# Patient Record
Sex: Female | Born: 1970 | Race: Black or African American | Hispanic: No | Smoking: Never smoker
Health system: Southern US, Community
[De-identification: ages and names within clinical notes are randomized; demographics above are authoritative.]

## PROBLEM LIST (undated history)

## (undated) ENCOUNTER — Emergency Department (HOSPITAL_COMMUNITY): Admission: EM | Payer: BC Managed Care – PPO | Source: Home / Self Care

## (undated) DIAGNOSIS — I10 Essential (primary) hypertension: Secondary | ICD-10-CM

## (undated) DIAGNOSIS — K219 Gastro-esophageal reflux disease without esophagitis: Secondary | ICD-10-CM

## (undated) DIAGNOSIS — I639 Cerebral infarction, unspecified: Secondary | ICD-10-CM

## (undated) DIAGNOSIS — D649 Anemia, unspecified: Secondary | ICD-10-CM

## (undated) DIAGNOSIS — R011 Cardiac murmur, unspecified: Secondary | ICD-10-CM

## (undated) DIAGNOSIS — E785 Hyperlipidemia, unspecified: Secondary | ICD-10-CM

## (undated) HISTORY — PX: ABDOMINAL HYSTERECTOMY: SHX81

## (undated) HISTORY — DX: Anemia, unspecified: D64.9

## (undated) HISTORY — DX: Hyperlipidemia, unspecified: E78.5

## (undated) HISTORY — DX: Cardiac murmur, unspecified: R01.1

## (undated) HISTORY — DX: Cerebral infarction, unspecified: I63.9

---

## 2007-02-07 ENCOUNTER — Other Ambulatory Visit: Admission: RE | Admit: 2007-02-07 | Discharge: 2007-02-07 | Payer: Self-pay | Admitting: Obstetrics and Gynecology

## 2007-02-12 ENCOUNTER — Ambulatory Visit (HOSPITAL_COMMUNITY): Admission: RE | Admit: 2007-02-12 | Discharge: 2007-02-12 | Payer: Self-pay | Admitting: Obstetrics & Gynecology

## 2008-01-10 ENCOUNTER — Encounter: Payer: Self-pay | Admitting: Obstetrics and Gynecology

## 2008-01-10 ENCOUNTER — Ambulatory Visit (HOSPITAL_COMMUNITY): Admission: RE | Admit: 2008-01-10 | Discharge: 2008-01-11 | Payer: Self-pay | Admitting: Obstetrics and Gynecology

## 2008-01-25 ENCOUNTER — Ambulatory Visit (HOSPITAL_COMMUNITY): Admission: RE | Admit: 2008-01-25 | Discharge: 2008-01-25 | Payer: Self-pay | Admitting: Obstetrics and Gynecology

## 2010-04-18 ENCOUNTER — Encounter: Payer: Self-pay | Admitting: Obstetrics & Gynecology

## 2010-08-10 NOTE — Op Note (Signed)
Robin Fuentes, Robin Fuentes NO.:  [id number]   MEDICAL RECORD NO.:  [id number]          PATIENT TYPE:  AMB   LOCATION:  DAY                           FACILITY:  [facility]   PHYSICIAN:  Tilda Burrow, M.D. DATE OF BIRTH:  1971-01-30   DATE OF PROCEDURE:  01/25/2008  DATE OF DISCHARGE:                               OPERATIVE REPORT   PREOPERATIVE DIAGNOSIS:  Vaginal cuff bleeding status post hysterectomy.   POSTOPERATIVE DIAGNOSIS:  Vaginal cuff bleeding status post  hysterectomy.   PROCEDURE:  Oversew of vaginal cuff.   SURGEON:  Tilda Burrow, MD.   ASSISTANTAne Payment..   ANESTHESIA:  General with LMA.   COMPLICATIONS:  None.   FINDINGS:  Separation of the vaginal cuff after apparent absorption of  sutures.   INDICATIONS:  A 40 year old female who was recovering nicely, 2-week  status post procedure, when she developed heavy bleeding and a clot seen  in the office where the bleeding was excessive for office closure.   DETAILS OF PROCEDURE:  The patient was taken to the operating room,  prepped and draped.  A time-out was conducted before the start of  procedure, patient, and providers confirmed.  A 3-inch weighted speculum  was placed in the vagina and in the lateral side rubber retractors were  used.  The Surgicel and 2 vaginal packs in the office were removed.  These were saturated with fairly fresh blood.  The cuff itself could  then be inspected and the uterosacral pedicles on either side were  visible and hemostatic, and showed evidence of healing by secondary  intention.  Between those vaginal cuff was fractionally opened and  fractionally disrupted.  There was no visible entry in the peritoneal  cavity.  The patient had easy closure of the cuff, beginning over to the  patient's left side and placing interrupted sutures of 0-Vicryl and  alternating with 2-0 Vicryl, sewing across the cuff with good tissue  edge approximation and good  hemostasis.  At the end of the procedure,  the tissues were reapproximated as previously  attached prior to the disruption.  It is unknown if the patient did  simply strain, lifted, or what event resulted in the disruption.  The  patient will be allowed to go home and follow up will be in 2 weeks in  our office or earlier p.r.n. problems.      Tilda Burrow, M.D.  Electronically Signed     JVF/MEDQ  D:  01/25/2008  T:  01/26/2008  Job:  [id number]

## 2010-08-10 NOTE — Op Note (Signed)
Robin Fuentes, Robin Fuentes NO.:  [id number]   MEDICAL RECORD NO.:  [id number]          PATIENT TYPE:  OBV   LOCATION:  A311                          FACILITY:  [facility]   PHYSICIAN:  Tilda Burrow, M.D. DATE OF BIRTH:  Aug 03, 1970   DATE OF PROCEDURE:  01/10/2008  DATE OF DISCHARGE:                               OPERATIVE REPORT   PREOPERATIVE DIAGNOSES:  1. Uterine fibroids.  2. Menorrhagia.  3. Right ovarian cyst.   POSTOPERATIVE DIAGNOSES:  1. Menorrhagia.  2. Uterine fibroids.  3. Right ovarian dermoid cyst.   PROCEDURE:  1. Laparoscopic right ovarian cystectomy.  2. Laparoscopically-assisted vaginal hysterectomy.   SURGEON:  Tilda Burrow, MD   ASSISTANT:  Marlinda Mike, RN   ANESTHESIA:  General.   COMPLICATIONS:  None.   ESTIMATED BLOOD LOSS:  150 mL.   FINDINGS:  Enlarged right ovary, normal-appearing left ovary, aspiration  of cyst structure on right ovary confirming dermoid cyst, which was then  removed, mobile uterus with fibroid deformity in the area of the right  ligament insertion area.   DETAILS OF PROCEDURE:  The patient was taken to the operating room,  prepped and draped for combined abdominal and vaginal procedure.  The  cervix was grasped with single-tooth tenaculum.  Uterine manipulator  inserted, then Foley catheter inserted.  Time-out had been connected and  procedures confirmed by all parties.  The infraumbilical vertical 1-cm  skin incision was performed, introducing a Veress needle, achieving  pneumoperitoneum easily with water droplet test used to confirm  intraperitoneal location.  The needle was oriented towards the pelvis.  Pneumoperitoneum was easily achieved and 10-mm laparoscopic trocar  introduced.  Abdomen was visualized and no evidence of trauma suspected.  Suprapubic, right lower quadrant, and left lower quadrant 5-mm trocars  were inserted.  Attention was then directed to the pelvis.  The right  ovary was easily  visualized as being abnormal with the appearance  suggestive of a dermoid.  There was no stigma ovulation on the right  side.  Left ovary appeared normal with evidence of recent ovulation.  The uterus was easily identified as being mobile along with the enlarged  fibroids.  Attention was directed to ovary and after a needle aspiration  revealed that there was in deed likely sebaceous material inside the  ovary.  The cyst was decompressed and then the overlying normal ovarian  tissue opened through a 3-cm opening and the cyst grasped with  laparoscopic-grasping devices and gradually peeled out of the ovary.  The patient then had inspection of the pedicles at some point, cautery  is necessary inside the ovary, this was achieved with adequate  hemostasis.  Attention was then directed to the uterus.  Hysterectomy  followed by identifying the right round ligament, grasping it, elevating  it, and then cross-clamping the utero-ovarian ligament and fallopian  tube, transecting those with harmonic scalpel.  The harmonic scalpel  malfunctioned.  They were no longer defectively cut, so we converted to  a Gyrus bipolar cautery transection device, which was then used to march  down through the right round ligament and  the broad ligament down to the  level of the uterine vessels.  The uterine vessels were not transected  laparoscopically.  On the left side, the similar procedure was completed  with the Gyrus cautery cutting 5-mm device.  At this point, the bladder  flap was partially developed anteriorly using Gyrus transection and  mobilization of the bladder flap.  We then discontinued the abdominal  procedure, removed all laparoscopic instruments, left the sleeves in  place, and decompressed the abdomen.  Attention was then moved to the  vaginal area, where the cervix could be grasped with single-tooth  tenaculum, retracted toward the vaginal introitus, and the cervix  circumscribed with Bovie  cautery.  Marcaine with epinephrine was  injected beneath the skin of the area to be cut.  Posterior colpotomy  incision was then performed and then the uterosacral ligaments on either  side clamped, cut, sutured, and ligated using Zeppelin clamps, Mayo  scissors, and 0-chromic suture ligature.  The lower and upper cardinal  ligaments were then serially clamped, cut, and suture ligated.  The  bladder flap was gradually pushed upward as the cardinal ligaments were  taken down.  We were very careful as we were extremely close to the  cervix and take small bites until this dissection proceeded.  Attention  was then made to identifying the remaining tissue between the  laparoscopic portions of dissection and the vaginal portions.  The  uterus required morcellation and the cervix and lower uterine segment  were taken out in a wedge and then the remaining portion of the uterus  could be rotated sufficiently with remaining pedicle on either side to  be clamped, transected, and ligated with 0-chromic.  This was the  specimen consisting of the uterine fundal, fundal portion was then  extracted.  Estimated uterine weight 175 g.   The hemostasis was quite good except for a small area on the posterior  colpotomy skin edges.  A suture of 2-0 Prolene was placed through the  uterosacral ligament on either side and loosely reapproximated to give  vaginal apex support.  This was tied and buried above the cuff.  Peritoneum was then closed in the midline pulling the bladder flap  peritoneum to the posterior colpotomy peritoneum.  The rest of the cuff  was then closed with running 2-0 chromic with good tissue edge  approximation.   We then went up to the pelvis again, reinflated, inspected the pelvis,  and found the vaginal cuff to have excellent hemostasis.  There was some  oozing from the bed of the ovarian cystectomy on the right and this was  resolved with point cautery performed laparoscopically.  The  patient  tolerated the procedure well, had irrigation of the abdomen, deflation  of the abdomen once again.  Closure of the umbilical site with 0-Vicryl  of the fascia and 3-0 Vicryl subcuticular closure of all 4 puncture  sites.  Vaginal packing was then placed.  Urine output was 250 mL  intraop.  The patient went to the recovery room in excellent condition.  Sponge and needle counts were correct.      Tilda Burrow, M.D.  Electronically Signed    JVF/MEDQ  D:  01/10/2008  T:  01/11/2008  Job:  [id number]   cc:   [facility]

## 2010-12-08 ENCOUNTER — Other Ambulatory Visit: Payer: Self-pay | Admitting: Obstetrics and Gynecology

## 2010-12-08 DIAGNOSIS — Z1231 Encounter for screening mammogram for malignant neoplasm of breast: Secondary | ICD-10-CM

## 2010-12-14 ENCOUNTER — Ambulatory Visit (HOSPITAL_COMMUNITY): Payer: Self-pay

## 2010-12-14 ENCOUNTER — Ambulatory Visit (HOSPITAL_COMMUNITY)
Admission: RE | Admit: 2010-12-14 | Discharge: 2010-12-14 | Disposition: A | Discharge status: Home / Self Care | Payer: Self-pay | Source: Ambulatory Visit | Attending: Obstetrics and Gynecology | Admitting: Obstetrics and Gynecology

## 2010-12-14 DIAGNOSIS — Z1231 Encounter for screening mammogram for malignant neoplasm of breast: Secondary | ICD-10-CM

## 2010-12-28 LAB — BASIC METABOLIC PANEL
GFR calc Af Amer: >60
Sodium: 136

## 2010-12-28 LAB — COMPREHENSIVE METABOLIC PANEL
ALT: 14
AST: 17
Albumin: 3.8
Alkaline Phosphatase: 72
Chloride: 105
Creatinine, Ser: 0.86
GFR calc non Af Amer: >60
Glucose, Bld: 72

## 2010-12-28 LAB — TYPE AND SCREEN
ABO/RH(D): B POS
Antibody Screen: NEGATIVE

## 2010-12-28 LAB — DIFFERENTIAL
Basophils Relative: 0
Eosinophils Absolute: 0
Eosinophils Relative: 0
Eosinophils Relative: 2
Lymphocytes Relative: 13
Lymphocytes Relative: 31
Lymphs Abs: 1.8
Monocytes Absolute: 1.1 — ABNORMAL HIGH
Monocytes Relative: 9
Neutro Abs: 3.8
Neutrophils Relative %: 78 — ABNORMAL HIGH

## 2010-12-28 LAB — CBC
HCT: 31.8 — ABNORMAL LOW
HCT: 37.9
HCT: 41.8
MCV: 74.3 — ABNORMAL LOW
Platelets: 274
RBC: 5.08
RDW: 12.8
RDW: 13.8
WBC: 5.8
WBC: 6.2

## 2011-10-12 ENCOUNTER — Encounter (HOSPITAL_COMMUNITY): Payer: Self-pay | Admitting: *Deleted

## 2011-10-12 ENCOUNTER — Emergency Department (HOSPITAL_COMMUNITY)
Admission: EM | Admit: 2011-10-12 | Discharge: 2011-10-12 | Disposition: A | Discharge status: Home / Self Care | Payer: Self-pay | Attending: Emergency Medicine | Admitting: Emergency Medicine

## 2011-10-12 ENCOUNTER — Emergency Department (HOSPITAL_COMMUNITY): Payer: Self-pay

## 2011-10-12 DIAGNOSIS — H6120 Impacted cerumen, unspecified ear: Secondary | ICD-10-CM | POA: Insufficient documentation

## 2011-10-12 DIAGNOSIS — K0889 Other specified disorders of teeth and supporting structures: Secondary | ICD-10-CM

## 2011-10-12 DIAGNOSIS — K089 Disorder of teeth and supporting structures, unspecified: Secondary | ICD-10-CM | POA: Insufficient documentation

## 2011-10-12 DIAGNOSIS — I10 Essential (primary) hypertension: Secondary | ICD-10-CM | POA: Insufficient documentation

## 2011-10-12 HISTORY — DX: Essential (primary) hypertension: I10

## 2011-10-12 MED ORDER — PENICILLIN V POTASSIUM 500 MG PO TABS: 500.0000 mg | ORAL_TABLET | Freq: Four times a day (QID) | ORAL | Status: AC

## 2011-10-12 MED ORDER — HYDROCODONE-ACETAMINOPHEN 5-325 MG PO TABS
2.0000 | ORAL_TABLET | Freq: Once | ORAL | Status: AC
  Administered 2011-10-12: 2 via ORAL
  Filled 2011-10-12: qty 2

## 2011-10-12 MED ORDER — HYDROCODONE-ACETAMINOPHEN 5-325 MG PO TABS: 2.0000 | ORAL_TABLET | ORAL | Status: AC | PRN

## 2011-10-12 NOTE — ED Provider Notes (Signed)
History     CSN: [id number]  Arrival date & time 10/12/11  Robin Fuentes   First MD Initiated Contact with Patient 10/12/11 2043      Chief Complaint  Patient presents with  . Otalgia  . Numbness    (Consider location/radiation/quality/duration/timing/severity/associated sxs/prior treatment) HPI Comments: Patient presents with right-sided facial pain, right ear pain, dental pain with numbness and tingling for the past 10 days. The pain is intermittent but getting more severe. No fever, vomiting, chills, difficulty breathing or swallowing. No chest pain or shortness of breath. No change in vision, no change in hearing. No focal weakness. She's been taking sinus medicine without relief. Denies any cough, rhinorrhea or congestion.  The history is provided by the patient.    Past Medical History  Diagnosis Date  . Hypertension     Past Surgical History  Procedure Date  . Abdominal hysterectomy     History reviewed. No pertinent family history.  History  Substance Use Topics  . Smoking status: Never Smoker   . Smokeless tobacco: Not on file  . Alcohol Use: No    OB History    Grav Para Term Preterm Abortions TAB SAB Ect Mult Living                  Review of Systems  Constitutional: Negative for fever, activity change and appetite change.  HENT: Positive for ear pain and dental problem. Negative for congestion, sore throat, rhinorrhea, trouble swallowing, neck pain, neck stiffness and voice change.   Respiratory: Negative for cough, chest tightness and shortness of breath.   Cardiovascular: Negative for chest pain.  Gastrointestinal: Negative for nausea, vomiting and abdominal pain.  Genitourinary: Negative for dysuria.  Neurological: Positive for numbness and headaches. Negative for dizziness, facial asymmetry, speech difficulty and weakness.    Allergies  Sulfa antibiotics  Home Medications   Current Outpatient Rx  Name Route Sig Dispense Refill  . ALBUTEROL SULFATE  HFA 108 (90 BASE) MCG/ACT IN AERS Inhalation Inhale 2 puffs into the lungs every 6 (six) hours as needed.    Marland Kitchen LISINOPRIL-HYDROCHLOROTHIAZIDE 20-12.5 MG PO TABS Oral Take 0.5 tablets by mouth every morning.    Marland Kitchen NAPROXEN SODIUM 220 MG PO CAPS Oral Take 220 mg by mouth as needed.    Marland Kitchen PSEUDOEPHEDRINE HCL 60 MG PO TABS Oral Take 60 mg by mouth every 4 (four) hours as needed.      BP 140/89  Pulse 93  Temp 98.3 F (36.8 C) (Oral)  Resp 20  Ht 5\' 4"  (1.626 m)  Wt 130 lb (58.968 kg)  BMI 22.31 kg/m2  SpO2 99%  Physical Exam  Constitutional: She is oriented to person, place, and time. She appears well-developed and well-nourished. No distress.  HENT:  Head: Normocephalic and atraumatic.  Left Ear: External ear normal.  Mouth/Throat: Oropharynx is clear and moist. No oropharyngeal exudate.       Cerumen impaction right Poor dentition throughout. Right upper molar tender to palpation without abscess. No tongue elevation, no trismus  Eyes: Conjunctivae and EOM are normal. Pupils are equal, round, and reactive to light.  Neck: Normal range of motion. Neck supple.       No menigismus  Cardiovascular: Normal rate, regular rhythm and normal heart sounds.   No murmur heard. Pulmonary/Chest: Effort normal and breath sounds normal. No respiratory distress.  Abdominal: Soft. There is no tenderness. There is no rebound and no guarding.  Musculoskeletal: Normal range of motion. She exhibits no edema and no  tenderness.  Neurological: She is alert and oriented to person, place, and time. No cranial nerve deficit.       No facial asymmetry, 5 out of strength throughout, no nystagmus, no ataxia finger to nose, visual fields full to confrontation  Skin: Skin is warm.    ED Course  Procedures (including critical care time)  Labs Reviewed - No data to display Ct Head Wo Contrast  10/12/2011  *RADIOLOGY REPORT*  Clinical Data: Right ear pain and right numbness and tingling for one and one half weeks.   CT HEAD WITHOUT CONTRAST  Technique:  Contiguous axial images were obtained from the base of the skull through the vertex without contrast.  Comparison: None.  Findings: The ventricles and sulci are symmetrical without significant effacement, displacement, or dilatation. No mass effect or midline shift. No abnormal extra-axial fluid collections. The grey-white matter junction is distinct. Basal cisterns are not effaced. No acute intracranial hemorrhage. No depressed skull fractures.  Visualized portions of the mastoid air cells and paranasal sinuses are not opacified.  The external auditory canal on the right is poorly defined and patency is not confirmed. Likely there is some opacification due to cerumen.  Correlate with otoscopic examination.  IMPRESSION: No acute intracranial abnormalities.  Original Report Authenticated By: Marlon Pel, M.D.     No diagnosis found.    MDM  Right ear pain, right facial pain, right dental pain. No fever, vomiting, chest pain or shortness of breath.  No facial asymmetry, no focal weakness, no ataxia.  No deficit to suggest CVA.  Will irrigate ear.  treat dental pain, follow up with dentist and PCP.       Glynn Octave, MD 10/12/11 2150

## 2011-10-12 NOTE — ED Notes (Signed)
Right ear pain and right numbness/ tingling ongoing off and on for 1 1/2 week

## 2012-02-29 ENCOUNTER — Other Ambulatory Visit (HOSPITAL_COMMUNITY): Payer: Self-pay | Admitting: Internal Medicine

## 2013-01-21 ENCOUNTER — Observation Stay (HOSPITAL_COMMUNITY): Payer: BC Managed Care – PPO

## 2013-01-21 ENCOUNTER — Inpatient Hospital Stay (HOSPITAL_COMMUNITY): Payer: BC Managed Care – PPO

## 2013-01-21 ENCOUNTER — Emergency Department (HOSPITAL_COMMUNITY): Payer: BC Managed Care – PPO

## 2013-01-21 ENCOUNTER — Inpatient Hospital Stay (HOSPITAL_COMMUNITY)
Admission: EM | Admit: 2013-01-21 | Discharge: 2013-01-24 | DRG: 093 | Disposition: A | Discharge status: Home / Self Care | Payer: BC Managed Care – PPO | Attending: Internal Medicine | Admitting: Internal Medicine

## 2013-01-21 ENCOUNTER — Encounter (HOSPITAL_COMMUNITY): Payer: Self-pay | Admitting: Emergency Medicine

## 2013-01-21 DIAGNOSIS — I639 Cerebral infarction, unspecified: Secondary | ICD-10-CM

## 2013-01-21 DIAGNOSIS — I10 Essential (primary) hypertension: Secondary | ICD-10-CM | POA: Diagnosis present

## 2013-01-21 DIAGNOSIS — I6350 Cerebral infarction due to unspecified occlusion or stenosis of unspecified cerebral artery: Secondary | ICD-10-CM

## 2013-01-21 DIAGNOSIS — K219 Gastro-esophageal reflux disease without esophagitis: Secondary | ICD-10-CM | POA: Diagnosis present

## 2013-01-21 DIAGNOSIS — J449 Chronic obstructive pulmonary disease, unspecified: Secondary | ICD-10-CM | POA: Diagnosis present

## 2013-01-21 DIAGNOSIS — R200 Anesthesia of skin: Secondary | ICD-10-CM | POA: Diagnosis present

## 2013-01-21 DIAGNOSIS — R209 Unspecified disturbances of skin sensation: Principal | ICD-10-CM | POA: Diagnosis present

## 2013-01-21 DIAGNOSIS — J4489 Other specified chronic obstructive pulmonary disease: Secondary | ICD-10-CM | POA: Diagnosis present

## 2013-01-21 HISTORY — DX: Gastro-esophageal reflux disease without esophagitis: K21.9

## 2013-01-21 LAB — RAPID URINE DRUG SCREEN, HOSP PERFORMED
Amphetamines: NOT DETECTED
Barbiturates: NOT DETECTED
Cocaine: NOT DETECTED
Tetrahydrocannabinol: NOT DETECTED

## 2013-01-21 LAB — LIPID PANEL
LDL Cholesterol: 95 mg/dL (ref 0–99)
Triglycerides: 60 mg/dL (ref <150)
VLDL: 12 mg/dL (ref 0–40)

## 2013-01-21 LAB — COMPREHENSIVE METABOLIC PANEL
ALT: 24 U/L (ref 0–35)
AST: 24 U/L (ref 0–37)
Calcium: 9.3 mg/dL (ref 8.4–10.5)
Creatinine, Ser: 0.87 mg/dL (ref 0.50–1.10)
GFR calc Af Amer: >90 mL/min (ref >90)
GFR calc non Af Amer: 80 mL/min — ABNORMAL LOW (ref >90)
Glucose, Bld: 100 mg/dL — ABNORMAL HIGH (ref 70–99)
Potassium: 3.6 mEq/L (ref 3.5–5.1)
Sodium: 134 mEq/L — ABNORMAL LOW (ref 135–145)
Total Bilirubin: 0.3 mg/dL (ref 0.3–1.2)
Total Protein: 8.1 g/dL (ref 6.0–8.3)

## 2013-01-21 LAB — HEMOGLOBIN A1C: Hgb A1c MFr Bld: 5.7 % — ABNORMAL HIGH (ref <5.7)

## 2013-01-21 LAB — DIFFERENTIAL
Eosinophils Absolute: 0.2 10*3/uL (ref 0.0–0.7)
Eosinophils Relative: 2 % (ref 0–5)
Lymphs Abs: 2.9 10*3/uL (ref 0.7–4.0)
Monocytes Relative: 7 % (ref 3–12)

## 2013-01-21 LAB — GLUCOSE, CAPILLARY: Glucose-Capillary: 92 mg/dL (ref 70–99)

## 2013-01-21 LAB — URINALYSIS, ROUTINE W REFLEX MICROSCOPIC
Bilirubin Urine: NEGATIVE
Nitrite: NEGATIVE
Protein, ur: NEGATIVE mg/dL
Specific Gravity, Urine: 1.015 (ref 1.005–1.030)
Urobilinogen, UA: 0.2 mg/dL (ref 0.0–1.0)

## 2013-01-21 LAB — HOMOCYSTEINE: Homocysteine: 10 umol/L (ref 4.0–15.4)

## 2013-01-21 LAB — CBC
HCT: 44.2 % (ref 36.0–46.0)
Hemoglobin: 14.5 g/dL (ref 12.0–15.0)
MCH: 24.5 pg — ABNORMAL LOW (ref 26.0–34.0)
MCV: 74.7 fL — ABNORMAL LOW (ref 78.0–100.0)
RBC: 5.92 MIL/uL — ABNORMAL HIGH (ref 3.87–5.11)

## 2013-01-21 LAB — RPR: RPR Ser Ql: NONREACTIVE

## 2013-01-21 LAB — TROPONIN I: Troponin I: <0.3 ng/mL (ref <0.30)

## 2013-01-21 LAB — MRSA PCR SCREENING: MRSA by PCR: NEGATIVE

## 2013-01-21 LAB — APTT: aPTT: 30 seconds (ref 24–37)

## 2013-01-21 MED ORDER — ENOXAPARIN SODIUM 40 MG/0.4ML ~~LOC~~ SOLN
40.0000 mg | SUBCUTANEOUS | Status: DC
  Administered 2013-01-21 – 2013-01-23 (×3): 40 mg via SUBCUTANEOUS
  Filled 2013-01-21 (×4): qty 0.4

## 2013-01-21 MED ORDER — ASPIRIN 325 MG PO TABS
325.0000 mg | ORAL_TABLET | Freq: Every day | ORAL | Status: DC
  Administered 2013-01-21 – 2013-01-24 (×4): 325 mg via ORAL
  Filled 2013-01-21 (×4): qty 1

## 2013-01-21 MED ORDER — ASPIRIN 300 MG RE SUPP
300.0000 mg | Freq: Every day | RECTAL | Status: DC
  Filled 2013-01-21 (×4): qty 1

## 2013-01-21 MED ORDER — ATENOLOL 25 MG PO TABS
50.0000 mg | ORAL_TABLET | Freq: Every day | ORAL | Status: DC
  Administered 2013-01-21 – 2013-01-24 (×4): 50 mg via ORAL
  Filled 2013-01-21 (×4): qty 2

## 2013-01-21 MED ORDER — SENNOSIDES-DOCUSATE SODIUM 8.6-50 MG PO TABS: 1.0000 | ORAL_TABLET | Freq: Every evening | ORAL | Status: DC | PRN

## 2013-01-21 MED ORDER — ALBUTEROL SULFATE HFA 108 (90 BASE) MCG/ACT IN AERS: 2.0000 | INHALATION_SPRAY | Freq: Four times a day (QID) | RESPIRATORY_TRACT | Status: DC | PRN

## 2013-01-21 NOTE — Evaluation (Signed)
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  She reported tingling in the right face and extremeties...this sx has resolved today per her report.  Clinical Impression   Pt was seen for evaluation and found to be at functional baseline.  She has no PT needs.    PT Assessment  Patent does not need any further PT services    Follow Up Recommendations  No PT follow up    Does the patient have the potential to tolerate intense rehabilitation      Barriers to Discharge        Equipment Recommendations  None recommended by PT    Recommendations for Other Services     Frequency      Precautions / Restrictions Precautions Precautions: None Restrictions Weight Bearing Restrictions: No   Pertinent Vitals/Pain       Mobility  Bed Mobility Bed Mobility: Supine to Sit;Sit to Supine Rolling Left: 7: Independent Supine to Sit: 7: Independent Sit to Supine: 7: Independent Transfers Sit to Stand: 7: Independent Stand to Sit: 7: Independent Details for Transfer Assistance: Patient did use bedrailing for hand hold during sit<>stand.   Ambulation/Gait Ambulation/Gait Assistance: 7: Independent Ambulation Distance (Feet): 175 Feet Assistive device: None Gait Pattern: Within Functional Limits Gait velocity: WNL General Gait Details: both hips are in external rotation during gait, but this is her norm Stairs: No Wheelchair Mobility Wheelchair Mobility: No    Exercises     PT Diagnosis:    PT Problem List:   PT Treatment Interventions:       PT Goals(Current goals can be found in the care plan section) Acute Rehab PT Goals Patient Stated Goal: to get better and go home  PT Goal Formulation: No goals set, d/c  therapy  Visit Information  Last PT Received On: 01/21/13 History of Present Illness: 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  She reported tingling in the right face and extremeties...this sx has resolved today per her report.       Prior Functioning  Home Living Family/patient expects to be discharged to:: Private residence Living Arrangements: Spouse/significant other Type of Home: Apartment Home Access: Stairs to enter [employer] of Steps: 25 Entrance Stairs-Rails: Can reach both Home Layout: One level Home Equipment: None Additional Comments: .  Prior Function Level of Independence: Independent Comments: +driving; +full time work Musician: No difficulties Dominant Hand: Right    Cognition  Cognition Arousal/Alertness: Awake/alert Behavior During Therapy: WFL for tasks assessed/performed Overall Cognitive Status: Within Functional Limits for tasks assessed    Extremity/Trunk Assessment Upper Extremity Assessment Upper Extremity Assessment: Overall WFL for tasks assessed;RUE deficits/detail RUE Deficits / Details: Patient reports RUE weakness with tasks compared to prior to admission.  RUE=LUE for this assessment.  Lower Extremity Assessment Lower Extremity Assessment: Overall WFL for tasks assessed Cervical / Trunk Assessment Cervical / Trunk Assessment: Normal   Balance Balance Balance Assessed: Yes Dynamic Standing Balance Dynamic Standing - Balance Support: No upper extremity supported Dynamic Standing - Level of Assistance: 6: Modified independent (Device/Increase time) High Level Balance High Level Balance Activites: Backward walking;Direction changes;Turns;Sudden stops;Head turns;Side stepping High Level Balance Comments: no LOB with above  End of Session PT - End of  Session Equipment Utilized During Treatment: Gait belt Activity Tolerance: Patient tolerated treatment well Patient left: in bed  GP      Myrlene Broker L 01/21/2013, 3:18 PM

## 2013-01-21 NOTE — Progress Notes (Signed)
Subjective: Patient was admitted last night due to rt side numbness. Her CT Scan was negative. Patient is feeling slightly better but still has the numbness.  Objective: Vital signs in last 24 hours: Temp:  [98.2 F (36.8 C)] 98.2 F (36.8 C) (10/27 0355) Pulse Rate:  [58-79] 58 (10/27 0730) Resp:  [13-22] 18 (10/27 0730) BP: (132-158)/(83-100) 132/89 mmHg (10/27 0730) SpO2:  [97 %-100 %] 100 % (10/27 0730) FiO2 (%):  [21 %] 21 % (10/27 0132) Weight:  [59.87 kg (131 lb 15.8 oz)-59.875 kg (132 lb)] 59.87 kg (131 lb 15.8 oz) (10/27 0346) Weight change:     Intake/Output from previous day:    PHYSICAL EXAM General appearance: alert and no distress Resp: clear to auscultation bilaterally Cardio: S1, S2 normal GI: soft, non-tender; bowel sounds normal; no masses,  no organomegaly Extremities: extremities normal, atraumatic, no cyanosis or edema  Lab Results:    @labtest @ [facility] No results found for this basename: PHART, PCO2, PO2ART, TCO2, HCO3,  in the last 72 hours CULTURES No results found for this or any previous visit (from the past 240 hour(s)). Studies/Results: Ct Head Wo Contrast  01/21/2013   CLINICAL DATA:  Hypertension, right-sided weakness.  EXAM: CT HEAD WITHOUT CONTRAST  TECHNIQUE: Contiguous axial images were obtained from the base of the skull through the vertex without intravenous contrast.  COMPARISON:  CT of head October 12, 2011  FINDINGS: The ventricles and sulci are normal. No intraparenchymal hemorrhage, mass effect nor midline shift. No acute large vascular territory infarcts.  No abnormal extra-axial fluid collections. Basal cisterns are patent.  No skull fracture. Visualized paranasal sinuses and mastoid air-cells are well-aerated. The included ocular globes and orbital contents are non-suspicious.  IMPRESSION: No acute intracranial process ; normal noncontrast CT of the head. If clinical concern for acute ischemia, MRI of the brain with diffusion-weighted  sequences would be more sensitive.   Electronically Signed   By: Awilda Metro   On: 01/21/2013 01:56    Medications: I have reviewed the patient's current medications.  Assesment: Active Problems:   Right facial numbness   HTN (hypertension)    Plan: Will continue telemetry Will follow MRI, Echo and carotid doppler Will do neurology consult.    LOS: 0 days   Robin Fuentes 01/21/2013, 8:11 AM

## 2013-01-21 NOTE — Progress Notes (Signed)
UR Chart Review Completed  

## 2013-01-21 NOTE — Consult Note (Signed)
[facility] Jamal Haskin A. Gerilyn Pilgrim, MD     [email or web address]          Robin Fuentes is an 42 y.o. female.   ASSESSMENT/PLAN:  1. Acute numbness involving the right facial and right upper extremity and to some degree the right leg. The differential diagnosis includes acute subcortical infarct. Risk factor is hypertension. The patient is relatively young however and if she turns her to have a stroke additional workup for stroke at a youngp may be needed. However, given her age multiple sclerosis/demyelinating to processes are also possibilities. Brain MRI and MRA will be obtained. Lipid panel, RPR, thyroid function tests and a homocystine levels will also be obtained. She think has a carotid and echo also pending. She will be maintained on aspirin therapy for now.  The patient is a 42 year old black female who has a history of hypertension presented with the acute onset of numbness and dysesthesias involving the right facial region and right upper extremity. There is also some symptoms involving the right leg. She does not report having no other weakness although she reports that the right leg gave out a couple of times yesterday. She woke up with the symptoms. She reports that they have not gotten worse. She does not report dysarthria, visual obscuration, blurred vision, diplopia or headaches. There is no chest pain, shortness of breath, GI GU symptoms. The review systems is otherwise negative.  GENERAL: This is a pleasant average weight female in no acute distress.  HEENT: Unremarkable.  ABDOMEN: soft  EXTREMITIES: No edema   BACK: Normal.  SKIN: Normal by inspection.    MENTAL STATUS: Alert and oriented. Speech, language and cognition are generally intact. Judgment and insight normal.   CRANIAL NERVES: Pupils are equal, round and reactive to light and accommodation; extra ocular movements are full, there is no significant nystagmus; visual fields are full; upper and lower  facial muscles are normal in strength and symmetric, there is no flattening of the nasolabial folds; tongue is midline; uvula is midline; shoulder elevation is normal.  MOTOR: Normal tone, bulk and strength; no pronator drift.  COORDINATION: Left finger to nose is normal, right finger to nose is normal, No rest tremor; no intention tremor; no postural tremor; no bradykinesia.  REFLEXES: Deep tendon reflexes are symmetrical and normal. Plantar responses are flexor bilaterally.   SENSATION: Mild reduction to light touch and temperature right facial region. The right side is actually normal however.      Past Medical History  Diagnosis Date  . Hypertension   . Acid reflux     Past Surgical History  Procedure Laterality Date  . Abdominal hysterectomy      History reviewed. No pertinent family history.  Social History:  reports that she has never smoked. She does not have any smokeless tobacco history on file. She reports that she does not drink alcohol or use illicit drugs.  Allergies:  Allergies  Allergen Reactions  . Aspirin   . Sulfa Antibiotics Itching and Rash    Medications:  Prior to Admission medications   Medication Sig Start Date End Date Taking? Authorizing Provider  atenolol (TENORMIN) 50 MG tablet Take 50 mg by mouth daily.   Yes Historical Provider, MD  pseudoephedrine (SUDAFED) 60 MG tablet Take 60 mg by mouth every 4 (four) hours as needed.   Yes Historical Provider, MD  albuterol (PROAIR HFA) 108 (90 BASE) MCG/ACT inhaler Inhale 2 puffs into the lungs every 6 (six) hours as needed.  Historical Provider, MD  Naproxen Sodium (ALEVE) 220 MG CAPS Take 220 mg by mouth as needed.    Historical Provider, MD     Scheduled Meds: . aspirin  300 mg Rectal Daily   Or  . aspirin  325 mg Oral Daily  . atenolol  50 mg Oral Daily  . enoxaparin (LOVENOX) injection  40 mg Subcutaneous Q24H   Continuous Infusions:  PRN Meds:.albuterol, senna-docusate   Blood  pressure 132/89, pulse 58, temperature 98.2 F (36.8 C), temperature source Oral, resp. rate 18, height 5\' 4"  (1.626 m), weight 59.87 kg (131 lb 15.8 oz), SpO2 100.00%.   Results for orders placed during the hospital encounter of 01/21/13 (from the past 48 hour(s))  GLUCOSE, CAPILLARY     Status: None   Collection Time    01/21/13  1:51 AM      Result Value Range   Glucose-Capillary 92  70 - 99 mg/dL   Comment 1 Documented in Chart     Comment 2 Notify RN    ETHANOL     Status: None   Collection Time    01/21/13  2:00 AM      Result Value Range   Alcohol, Ethyl (B) <11  0 - 11 mg/dL   Comment:            LOWEST DETECTABLE LIMIT FOR     SERUM ALCOHOL IS 11 mg/dL     FOR MEDICAL PURPOSES ONLY  PROTIME-INR     Status: None   Collection Time    01/21/13  2:00 AM      Result Value Range   Prothrombin Time 13.7  11.6 - 15.2 seconds   INR 1.07  0.00 - 1.49  APTT     Status: None   Collection Time    01/21/13  2:00 AM      Result Value Range   aPTT 30  24 - 37 seconds  CBC     Status: Abnormal   Collection Time    01/21/13  2:00 AM      Result Value Range   WBC 7.6  4.0 - 10.5 K/uL   RBC 5.92 (*) 3.87 - 5.11 MIL/uL   Hemoglobin 14.5  12.0 - 15.0 g/dL   HCT 84.6  96.2 - 95.2 %   MCV 74.7 (*) 78.0 - 100.0 fL   MCH 24.5 (*) 26.0 - 34.0 pg   MCHC 32.8  30.0 - 36.0 g/dL   RDW 84.1  32.4 - 40.1 %   Platelets 168  150 - 400 K/uL  DIFFERENTIAL     Status: None   Collection Time    01/21/13  2:00 AM      Result Value Range   Neutrophils Relative % 52  43 - 77 %   Neutro Abs 4.0  1.7 - 7.7 K/uL   Lymphocytes Relative 38  12 - 46 %   Lymphs Abs 2.9  0.7 - 4.0 K/uL   Monocytes Relative 7  3 - 12 %   Monocytes Absolute 0.6  0.1 - 1.0 K/uL   Eosinophils Relative 2  0 - 5 %   Eosinophils Absolute 0.2  0.0 - 0.7 K/uL   Basophils Relative 0  0 - 1 %   Basophils Absolute 0.0  0.0 - 0.1 K/uL  TROPONIN I     Status: None   Collection Time    01/21/13  2:00 AM      Result Value Range    Troponin I <  0.30  <0.30 ng/mL   Comment:            Due to the release kinetics of cTnI,     a negative result within the first hours     of the onset of symptoms does not rule out     myocardial infarction with certainty.     If myocardial infarction is still suspected,     repeat the test at appropriate intervals.  COMPREHENSIVE METABOLIC PANEL     Status: Abnormal   Collection Time    01/21/13  2:00 AM      Result Value Range   Sodium 134 (*) 135 - 145 mEq/L   Potassium 3.6  3.5 - 5.1 mEq/L   Chloride 98  96 - 112 mEq/L   CO2 26  19 - 32 mEq/L   Glucose, Bld 100 (*) 70 - 99 mg/dL   BUN 12  6 - 23 mg/dL   Creatinine, Ser 8.29  0.50 - 1.10 mg/dL   Calcium 9.3  8.4 - 56.2 mg/dL   Total Protein 8.1  6.0 - 8.3 g/dL   Albumin 4.2  3.5 - 5.2 g/dL   AST 24  0 - 37 U/L   ALT 24  0 - 35 U/L   Alkaline Phosphatase 94  39 - 117 U/L   Total Bilirubin 0.3  0.3 - 1.2 mg/dL   GFR calc non Af Amer 80 (*) >90 mL/min   GFR calc Af Amer >90  >90 mL/min   Comment: (NOTE)     The eGFR has been calculated using the CKD EPI equation.     This calculation has not been validated in all clinical situations.     eGFR's persistently <90 mL/min signify possible Chronic Kidney     Disease.  URINE RAPID DRUG SCREEN (HOSP PERFORMED)     Status: None   Collection Time    01/21/13  2:09 AM      Result Value Range   Opiates NONE DETECTED  NONE DETECTED   Cocaine NONE DETECTED  NONE DETECTED   Benzodiazepines NONE DETECTED  NONE DETECTED   Amphetamines NONE DETECTED  NONE DETECTED   Tetrahydrocannabinol NONE DETECTED  NONE DETECTED   Barbiturates NONE DETECTED  NONE DETECTED   Comment:            DRUG SCREEN FOR MEDICAL PURPOSES     ONLY.  IF CONFIRMATION IS NEEDED     FOR ANY PURPOSE, NOTIFY LAB     WITHIN 5 DAYS.                LOWEST DETECTABLE LIMITS     FOR URINE DRUG SCREEN     Drug Class       Cutoff (ng/mL)     Amphetamine      1000     Barbiturate      200     Benzodiazepine   200      Tricyclics       300     Opiates          300     Cocaine          300     THC              50  URINALYSIS, ROUTINE W REFLEX MICROSCOPIC     Status: None   Collection Time    01/21/13  2:09 AM      Result Value Range   Color, Urine YELLOW  YELLOW   APPearance CLEAR  CLEAR   Specific Gravity, Urine 1.015  1.005 - 1.030   pH 6.0  5.0 - 8.0   Glucose, UA NEGATIVE  NEGATIVE mg/dL   Hgb urine dipstick NEGATIVE  NEGATIVE   Bilirubin Urine NEGATIVE  NEGATIVE   Ketones, ur NEGATIVE  NEGATIVE mg/dL   Protein, ur NEGATIVE  NEGATIVE mg/dL   Urobilinogen, UA 0.2  0.0 - 1.0 mg/dL   Nitrite NEGATIVE  NEGATIVE   Leukocytes, UA NEGATIVE  NEGATIVE   Comment: MICROSCOPIC NOT DONE ON URINES WITH NEGATIVE PROTEIN, BLOOD, LEUKOCYTES, NITRITE, OR GLUCOSE <1000 mg/dL.  LIPID PANEL     Status: None   Collection Time    01/21/13  5:19 AM      Result Value Range   Cholesterol 155  0 - 200 mg/dL   Triglycerides 60  <161 mg/dL   HDL 48  >09 mg/dL   Total CHOL/HDL Ratio 3.2     VLDL 12  0 - 40 mg/dL   LDL Cholesterol 95  0 - 99 mg/dL   Comment:            Total Cholesterol/HDL:CHD Risk     Coronary Heart Disease Risk Table                         Men   Women      1/2 Average Risk   3.4   3.3      Average Risk       5.0   4.4      2 X Average Risk   9.6   7.1      3 X Average Risk  23.4   11.0                Use the calculated Patient Ratio     above and the CHD Risk Table     to determine the patient's CHD Risk.                ATP III CLASSIFICATION (LDL):      <100     mg/dL   Optimal      604-540  mg/dL   Near or Above                        Optimal      130-159  mg/dL   Borderline      981-191  mg/dL   High      >478     mg/dL   Very High    Ct Head Wo Contrast  01/21/2013   CLINICAL DATA:  Hypertension, right-sided weakness.  EXAM: CT HEAD WITHOUT CONTRAST  TECHNIQUE: Contiguous axial images were obtained from the base of the skull through the vertex without intravenous contrast.   COMPARISON:  CT of head October 12, 2011  FINDINGS: The ventricles and sulci are normal. No intraparenchymal hemorrhage, mass effect nor midline shift. No acute large vascular territory infarcts.  No abnormal extra-axial fluid collections. Basal cisterns are patent.  No skull fracture. Visualized paranasal sinuses and mastoid air-cells are well-aerated. The included ocular globes and orbital contents are non-suspicious.  IMPRESSION: No acute intracranial process ; normal noncontrast CT of the head. If clinical concern for acute ischemia, MRI of the brain with diffusion-weighted sequences would be more sensitive.   Electronically Signed   By: Awilda Metro   On: 01/21/2013 01:56  Omega Slager A. Gerilyn Pilgrim, M.D.  Diplomate, Biomedical engineer of Psychiatry and Neurology ( Neurology). 01/21/2013, 8:33 AM

## 2013-01-21 NOTE — ED Provider Notes (Signed)
CSN: [id number]     Arrival date & time 01/21/13  0106 History   First MD Initiated Contact with Patient 01/21/13 0114     Chief Complaint  Patient presents with  . Cough  . Numbness    Patient is a 42 y.o. female presenting with weakness. The history is provided by the patient.  Weakness This is a new problem. The current episode started 12 to 24 hours ago. The problem occurs constantly. The problem has been gradually worsening. Pertinent negatives include no chest pain, no abdominal pain, no headaches and no shortness of breath. Nothing aggravates the symptoms. Nothing relieves the symptoms. She has tried rest for the symptoms. The treatment provided no relief.  pt reports she woke up Sunday morning (>12 hrs ago, LKW Saturday night >24 hours ago) With "numbness to right face"  She also reports weakness to right face/right  Arm/right leg.  She reports it is just "tingling" No cp/sob She reports dry cough No HA No visual changes +dizziness but no syncope No recent travel No vomiting but she does report recent diarrhea    Past Medical History  Diagnosis Date  . Hypertension   . Acid reflux    Past Surgical History  Procedure Laterality Date  . Abdominal hysterectomy     History reviewed. No pertinent family history. History  Substance Use Topics  . Smoking status: Never Smoker   . Smokeless tobacco: Not on file  . Alcohol Use: No   OB History   Grav Para Term Preterm Abortions TAB SAB Ect Mult Living                 Review of Systems  Constitutional: Negative for fever.  Eyes: Negative for visual disturbance.  Respiratory: Negative for shortness of breath.   Cardiovascular: Negative for chest pain.  Gastrointestinal: Positive for diarrhea. Negative for vomiting and abdominal pain.  Genitourinary: Negative for vaginal bleeding and vaginal discharge.  Neurological: Positive for dizziness and weakness. Negative for syncope and headaches.  All other systems reviewed  and are negative.    Allergies  Aspirin and Sulfa antibiotics  Home Medications   Current Outpatient Rx  Name  Route  Sig  Dispense  Refill  . atenolol (TENORMIN) 50 MG tablet   Oral   Take 50 mg by mouth daily.         Marland Kitchen albuterol (PROAIR HFA) 108 (90 BASE) MCG/ACT inhaler   Inhalation   Inhale 2 puffs into the lungs every 6 (six) hours as needed.         Marland Kitchen lisinopril-hydrochlorothiazide (PRINZIDE,ZESTORETIC) 20-12.5 MG per tablet   Oral   Take 0.5 tablets by mouth every morning.         . Naproxen Sodium (ALEVE) 220 MG CAPS   Oral   Take 220 mg by mouth as needed.         . pseudoephedrine (SUDAFED) 60 MG tablet   Oral   Take 60 mg by mouth every 4 (four) hours as needed.          BP 157/94  Pulse 64  Temp(Src) 98.2 F (36.8 C) (Oral)  Resp 19  Ht 5\' 4"  (1.626 m)  Wt 132 lb (59.875 kg)  BMI 22.65 kg/m2  SpO2 100% Physical Exam CONSTITUTIONAL: Well developed/well nourished HEAD: Normocephalic/atraumatic EYES: EOMI/PERRL, no nystagmus ENMT: Mucous membranes moist NECK: supple no meningeal signs, no bruits SPINE:entire spine nontender CV: S1/S2 noted, no murmurs/rubs/gallops noted LUNGS: Lungs are clear to auscultation bilaterally, no apparent  distress ABDOMEN: soft, nontender, no rebound or guarding GU:no cva tenderness NEURO:Awake/alert,?right facial droop.  No arm drift.  Minimal right LE drift noted.  She reports "tingling" to right face/arm/leg.   EXTREMITIES: pulses normal, full ROM SKIN: warm, color normal PSYCH: no abnormalities of mood noted   ED Course  Procedures Labs Review Labs Reviewed  GLUCOSE, CAPILLARY  ETHANOL  PROTIME-INR  APTT  CBC  DIFFERENTIAL  URINE RAPID DRUG SCREEN (HOSP PERFORMED)  URINALYSIS, ROUTINE W REFLEX MICROSCOPIC  TROPONIN I  COMPREHENSIVE METABOLIC PANEL   Imaging Review Ct Head Wo Contrast  01/21/2013   CLINICAL DATA:  Hypertension, right-sided weakness.  EXAM: CT HEAD WITHOUT CONTRAST   TECHNIQUE: Contiguous axial images were obtained from the base of the skull through the vertex without intravenous contrast.  COMPARISON:  CT of head October 12, 2011  FINDINGS: The ventricles and sulci are normal. No intraparenchymal hemorrhage, mass effect nor midline shift. No acute large vascular territory infarcts.  No abnormal extra-axial fluid collections. Basal cisterns are patent.  No skull fracture. Visualized paranasal sinuses and mastoid air-cells are well-aerated. The included ocular globes and orbital contents are non-suspicious.  IMPRESSION: No acute intracranial process ; normal noncontrast CT of the head. If clinical concern for acute ischemia, MRI of the brain with diffusion-weighted sequences would be more sensitive.   Electronically Signed   By: Awilda Metro   On: 01/21/2013 01:56    EKG Interpretation     Ventricular Rate:  61 PR Interval:  156 QRS Duration: 78 QT Interval:  438 QTC Calculation: 440 R Axis:   56 Text Interpretation:  Normal sinus rhythm Cannot rule out Anterior infarct , age undetermined Abnormal ECG No previous ECGs available nml axis nml intervals            2:15 AM Pt initially reported cough, then proceeded to discuss numbness to face as well as right arm/leg and ?weakness.  Will initiate stroke workup.  tPA in stroke considered but not given due to:  Onset over 3-4.5hours (LKW >24 hours ago) 2:55 AM D/w dr  Sharl Ma, will admit to tele for stroke rule out   MDM  No diagnosis found. Nursing notes including past medical history and social history reviewed and considered in documentation Labs/vital reviewed and considered Previous records reviewed and considered - previous ED visit reviewed     Joya Gaskins, MD 01/21/13 [id number]

## 2013-01-21 NOTE — H&P (Signed)
PCP:   FANTA,TESFAYE, MD   Chief Complaint:  Numbness of right-side  HPI: 42 year old female who came to the ED with chief complaints of cough and also numbness involving the right side of the face with some tingling sensation in the right arm and leg, patient says that symptoms started yesterday morning at 5 AM, almost 24 hours ago. She complained of some dizziness but no passing out, no weakness of the upper or lower extremity, she denies any slurred speech. No chest pain, no shortness of breath, no fever. Patient has had cough for last 3 days and it is mainly dry cough, she has been unable to cough up any phlegm. Patient has ongoing numbness of the face and right side of the body. CT head done in the ED is negative for stroke.  Allergies:   Allergies  Allergen Reactions  . Aspirin   . Sulfa Antibiotics Itching and Rash      Past Medical History  Diagnosis Date  . Hypertension   . Acid reflux     Past Surgical History  Procedure Laterality Date  . Abdominal hysterectomy      Prior to Admission medications   Medication Sig Start Date End Date Taking? Authorizing Provider  atenolol (TENORMIN) 50 MG tablet Take 50 mg by mouth daily.   Yes Historical Provider, MD  albuterol (PROAIR HFA) 108 (90 BASE) MCG/ACT inhaler Inhale 2 puffs into the lungs every 6 (six) hours as needed.    Historical Provider, MD  lisinopril-hydrochlorothiazide (PRINZIDE,ZESTORETIC) 20-12.5 MG per tablet Take 0.5 tablets by mouth every morning.    Historical Provider, MD  Naproxen Sodium (ALEVE) 220 MG CAPS Take 220 mg by mouth as needed.    Historical Provider, MD  pseudoephedrine (SUDAFED) 60 MG tablet Take 60 mg by mouth every 4 (four) hours as needed.    Historical Provider, MD    Social History:  reports that she has never smoked. She does not have any smokeless tobacco history on file. She reports that she does not drink alcohol or use illicit drugs. All the positives are listed in BOLD  Review of  Systems:  HEENT: Headache, blurred vision, runny nose, sore throat, cough Neck: Hypothyroidism, hyperthyroidism,,lymphadenopathy Chest : Shortness of breath, history of COPD, Asthma Heart : Chest pain, history of coronary arterey disease GI:  Nausea, vomiting, diarrhea, constipation, GERD GU: Dysuria, urgency, frequency of urination, hematuria Neuro: Stroke, seizures, syncope Psych: Depression, anxiety, hallucinations   Physical Exam: Blood pressure 158/95, pulse 59, temperature 98.2 F (36.8 C), temperature source Oral, resp. rate 17, height 5\' 4"  (1.626 m), weight 59.875 kg (132 lb), SpO2 99.00%. Constitutional:   Patient is a well-developed and well-nourished female* in no acute distress and cooperative with exam. Head: Normocephalic and atraumatic Mouth: Mucus membranes moist Eyes: PERRL, EOMI, conjunctivae normal Neck: Supple, No Thyromegaly Cardiovascular: RRR, S1 normal, S2 normal Pulmonary/Chest: CTAB, no wheezes, rales, or rhonchi Abdominal: Soft. Non-tender, non-distended, bowel sounds are normal, no masses, organomegaly, or guarding present.  Neurological: A&O x3, Strenght is mildly reduced in the right lower extremity and right upper extremity, no focal motor deficit, sensory reduced on right side of the face. She has mild facial droop on right. Otherwise cranial nerve II-XII are grossly intact, Extremities : No Cyanosis, Clubbing or Edema   Labs on Admission:  Results for orders placed during the hospital encounter of 01/21/13 (from the past 48 hour(s))  GLUCOSE, CAPILLARY     Status: None   Collection Time    01/21/13  1:51 AM      Result Value Range   Glucose-Capillary 92  70 - 99 mg/dL   Comment 1 Documented in Chart     Comment 2 Notify RN    ETHANOL     Status: None   Collection Time    01/21/13  2:00 AM      Result Value Range   Alcohol, Ethyl (B) <11  0 - 11 mg/dL   Comment:            LOWEST DETECTABLE LIMIT FOR     SERUM ALCOHOL IS 11 mg/dL     FOR  MEDICAL PURPOSES ONLY  PROTIME-INR     Status: None   Collection Time    01/21/13  2:00 AM      Result Value Range   Prothrombin Time 13.7  11.6 - 15.2 seconds   INR 1.07  0.00 - 1.49  APTT     Status: None   Collection Time    01/21/13  2:00 AM      Result Value Range   aPTT 30  24 - 37 seconds  CBC     Status: Abnormal   Collection Time    01/21/13  2:00 AM      Result Value Range   WBC 7.6  4.0 - 10.5 K/uL   RBC 5.92 (*) 3.87 - 5.11 MIL/uL   Hemoglobin 14.5  12.0 - 15.0 g/dL   HCT 45.4  09.8 - 11.9 %   MCV 74.7 (*) 78.0 - 100.0 fL   MCH 24.5 (*) 26.0 - 34.0 pg   MCHC 32.8  30.0 - 36.0 g/dL   RDW 14.7  82.9 - 56.2 %   Platelets 168  150 - 400 K/uL  DIFFERENTIAL     Status: None   Collection Time    01/21/13  2:00 AM      Result Value Range   Neutrophils Relative % 52  43 - 77 %   Neutro Abs 4.0  1.7 - 7.7 K/uL   Lymphocytes Relative 38  12 - 46 %   Lymphs Abs 2.9  0.7 - 4.0 K/uL   Monocytes Relative 7  3 - 12 %   Monocytes Absolute 0.6  0.1 - 1.0 K/uL   Eosinophils Relative 2  0 - 5 %   Eosinophils Absolute 0.2  0.0 - 0.7 K/uL   Basophils Relative 0  0 - 1 %   Basophils Absolute 0.0  0.0 - 0.1 K/uL  TROPONIN I     Status: None   Collection Time    01/21/13  2:00 AM      Result Value Range   Troponin I <0.30  <0.30 ng/mL   Comment:            Due to the release kinetics of cTnI,     a negative result within the first hours     of the onset of symptoms does not rule out     myocardial infarction with certainty.     If myocardial infarction is still suspected,     repeat the test at appropriate intervals.  COMPREHENSIVE METABOLIC PANEL     Status: Abnormal   Collection Time    01/21/13  2:00 AM      Result Value Range   Sodium 134 (*) 135 - 145 mEq/L   Potassium 3.6  3.5 - 5.1 mEq/L   Chloride 98  96 - 112 mEq/L   CO2 26  19 - 32  mEq/L   Glucose, Bld 100 (*) 70 - 99 mg/dL   BUN 12  6 - 23 mg/dL   Creatinine, Ser 0.98  0.50 - 1.10 mg/dL   Calcium 9.3  8.4 -  11.9 mg/dL   Total Protein 8.1  6.0 - 8.3 g/dL   Albumin 4.2  3.5 - 5.2 g/dL   AST 24  0 - 37 U/L   ALT 24  0 - 35 U/L   Alkaline Phosphatase 94  39 - 117 U/L   Total Bilirubin 0.3  0.3 - 1.2 mg/dL   GFR calc non Af Amer 80 (*) >90 mL/min   GFR calc Af Amer >90  >90 mL/min   Comment: (NOTE)     The eGFR has been calculated using the CKD EPI equation.     This calculation has not been validated in all clinical situations.     eGFR's persistently <90 mL/min signify possible Chronic Kidney     Disease.  URINE RAPID DRUG SCREEN (HOSP PERFORMED)     Status: None   Collection Time    01/21/13  2:09 AM      Result Value Range   Opiates NONE DETECTED  NONE DETECTED   Cocaine NONE DETECTED  NONE DETECTED   Benzodiazepines NONE DETECTED  NONE DETECTED   Amphetamines NONE DETECTED  NONE DETECTED   Tetrahydrocannabinol NONE DETECTED  NONE DETECTED   Barbiturates NONE DETECTED  NONE DETECTED   Comment:            DRUG SCREEN FOR MEDICAL PURPOSES     ONLY.  IF CONFIRMATION IS NEEDED     FOR ANY PURPOSE, NOTIFY LAB     WITHIN 5 DAYS.                LOWEST DETECTABLE LIMITS     FOR URINE DRUG SCREEN     Drug Class       Cutoff (ng/mL)     Amphetamine      1000     Barbiturate      200     Benzodiazepine   200     Tricyclics       300     Opiates          300     Cocaine          300     THC              50  URINALYSIS, ROUTINE W REFLEX MICROSCOPIC     Status: None   Collection Time    01/21/13  2:09 AM      Result Value Range   Color, Urine YELLOW  YELLOW   APPearance CLEAR  CLEAR   Specific Gravity, Urine 1.015  1.005 - 1.030   pH 6.0  5.0 - 8.0   Glucose, UA NEGATIVE  NEGATIVE mg/dL   Hgb urine dipstick NEGATIVE  NEGATIVE   Bilirubin Urine NEGATIVE  NEGATIVE   Ketones, ur NEGATIVE  NEGATIVE mg/dL   Protein, ur NEGATIVE  NEGATIVE mg/dL   Urobilinogen, UA 0.2  0.0 - 1.0 mg/dL   Nitrite NEGATIVE  NEGATIVE   Leukocytes, UA NEGATIVE  NEGATIVE   Comment: MICROSCOPIC NOT DONE ON  URINES WITH NEGATIVE PROTEIN, BLOOD, LEUKOCYTES, NITRITE, OR GLUCOSE <1000 mg/dL.    Radiological Exams on Admission: Ct Head Wo Contrast  01/21/2013   CLINICAL DATA:  Hypertension, right-sided weakness.  EXAM: CT HEAD WITHOUT CONTRAST  TECHNIQUE: Contiguous axial images were obtained  from the base of the skull through the vertex without intravenous contrast.  COMPARISON:  CT of head October 12, 2011  FINDINGS: The ventricles and sulci are normal. No intraparenchymal hemorrhage, mass effect nor midline shift. No acute large vascular territory infarcts.  No abnormal extra-axial fluid collections. Basal cisterns are patent.  No skull fracture. Visualized paranasal sinuses and mastoid air-cells are well-aerated. The included ocular globes and orbital contents are non-suspicious.  IMPRESSION: No acute intracranial process ; normal noncontrast CT of the head. If clinical concern for acute ischemia, MRI of the brain with diffusion-weighted sequences would be more sensitive.   Electronically Signed   By: Awilda Metro   On: 01/21/2013 01:56    Assessment/Plan Active Problems:   Right facial numbness   HTN (hypertension)  42 year old female with history of hypertension now coming with right facial numbness and peeling of the right upper and lower extremity. Initial workup with a CT head is negative for stroke, patient does have some right facial droop and along with history of hypertension. Patient will be admitted for stroke workup. Will obtain MRI MRA of the brain,  carotid ultrasound, 2-D echo, fasting lipid panel, hemoglobin A1c. Will hold the antihypertensive medications for permissive hypertension.  Code status: Full code  Family discussion: Discussed with mother at bedside   Time Spent on Admission: 75 min  LAMA,GAGAN S [facility] Pager: [phone] 01/21/2013, 3:32 AM  If 7PM-7AM, please contact night-coverage  [email or web address]  Password [email or web address]

## 2013-01-21 NOTE — ED Notes (Signed)
Pt reports waking up on Sunday morning with a cough and a "cool, tingly, numb" feeling mostly on right side of face.  Reports symptoms have remained all day.  Denies nausea or vomiting.  Reporting cough has been non-productive.

## 2013-01-21 NOTE — Evaluation (Signed)
Occupational Therapy Evaluation Patient Details Name: JAMESYN MOOREFIELD MRN: [id number] DOB: 07-08-1970 Today's Date: 01/21/2013 Time: 4098-1191 OT Time Calculation (min): 22 min Evaluation 1118-1140 (22')  OT Assessment / Plan / Recommendation History of present illness Patient is  a 42 year old female who came to the ED with chief complaints of cough and also numbness involving the right side of the face with some tingling sensation in the right arm and leg, patient says that symptoms started yesterday morning at 5 AM, almost 24 hours ago. She complained of some dizziness but no passing out, no weakness of the upper or lower extremity, she denies any slurred speech. No chest pain, no shortness of breath, no fever. Patient has had cough for last 3 days and it is mainly dry cough, she has been unable to cough up any phlegm.   Clinical Impression   Patient presents with good safety awareness, good balance and independence with mobility and functional ADL tasks.  Patient with complaint of slight weakness on Right side this date.  Manual Muscle testing RUE=LUE and demonstrates good coordination both gross and fine for ADL tasks.  Recommend no skilled OT services at this time as patient at/near baseline.      OT Assessment  Patient does not need any further OT services    Follow Up Recommendations  No OT follow up;Other - recommend patient sit for showering at this time if she feels weak/dizziness, lighthead to prevent injury.      Barriers to Discharge  good family support.           Frequency    No skilled OT services recommended at this time    Precautions / Restrictions Precautions Precautions: None Restrictions Weight Bearing Restrictions: No       ADL  Eating/Feeding: Modified independent Grooming: Modified independent Upper Body Dressing: Set up Lower Body Dressing: Set up Toilet Transfer: Modified independent Toilet Transfer Equipment: Comfort height toilet ADL Comments:  Patient setup only due to environment.  Demos ability to complete tasks safely with increased time this date.  No loss of balance for LB dressing or standing at sink for grooming tasks.     OT Diagnosis:   Facial weakness/droop   OT Goals(Current goals can be found in the care plan section) Acute Rehab OT Goals Patient Stated Goal: to get better and go home  OT Goal Formulation: With patient/family Potential to Achieve Goals: Good  Visit Information  History of Present Illness: Patient is  a 42 year old female who came to the ED with chief complaints of cough and also numbness involving the right side of the face with some tingling sensation in the right arm and leg, patient says that symptoms started yesterday morning at 5 AM, almost 24 hours ago. She complained of some dizziness but no passing out, no weakness of the upper or lower extremity, she denies any slurred speech. No chest pain, no shortness of breath, no fever. Patient has had cough for last 3 days and it is mainly dry cough, she has been unable to cough up any phlegm.       Prior Functioning     Home Living Family/patient expects to be discharged to:: Private residence Living Arrangements: Spouse/significant other Type of Home: Apartment Home Access: Stairs to enter [employer] of Steps: 25 Entrance Stairs-Rails: Can reach both Home Layout: One level Home Equipment: None Additional Comments: .  Prior Function Level of Independence: Independent Comments: +driving; +full time work Musician: No difficulties Dominant Hand: Right  Vision/Perception Vision - History Baseline Vision: Wears glasses only for reading Patient Visual Report: No change from baseline Vision - Assessment Eye Alignment: Within Functional Limits Perception Perception: Within Functional Limits   Cognition  Cognition Arousal/Alertness: Awake/alert Behavior During Therapy: WFL for tasks  assessed/performed Overall Cognitive Status: Within Functional Limits for tasks assessed    Extremity/Trunk Assessment Upper Extremity Assessment Upper Extremity Assessment: Overall WFL for tasks assessed;RUE deficits/detail RUE Deficits / Details: Patient reports RUE weakness with tasks compared to prior to admission.  RUE=LUE for this assessment.      Mobility Bed Mobility Bed Mobility: Rolling Left Rolling Left: 7: Independent Transfers Transfers: Sit to Stand;Stand to Sit Sit to Stand: 6: Modified independent (Device/Increase time) Stand to Sit: 6: Modified independent (Device/Increase time) Details for Transfer Assistance: Patient did use bedrailing for hand hold during sit<>stand.             End of Session OT - End of Session Activity Tolerance: Patient tolerated treatment well Patient left: in bed;with call bell/phone within reach;with family/visitor present Nurse Communication: Mobility status  GO     Velora Mediate, OTR/L  01/21/2013, 11:41 AM

## 2013-01-22 DIAGNOSIS — I519 Heart disease, unspecified: Secondary | ICD-10-CM

## 2013-01-22 NOTE — Progress Notes (Signed)
*  PRELIMINARY RESULTS* Echocardiogram 2D Echocardiogram has been performed.  Srishti Strnad 01/22/2013, 10:15 AM

## 2013-01-22 NOTE — Progress Notes (Signed)
Patient ID: Robin Fuentes, female   DOB: 1970/08/26, 42 y.o.   MRN: [id number]  [facility] NEUROLOGY Robin Altier A. Gerilyn Pilgrim, MD     [email or web address]          Robin Fuentes is an 42 y.o. female.   Assessment/Plan: 1. Unexplained right facial and right upper extremity symptoms. It appears that the spells are episodic occurring a few times before. The workup so far has been unrevealing. Given the episodic nature, an EEG will be obtained. This can be done as outpatient setting is not available tomorrow.  The patient reports that she has improved significantly although still reporting some mild discomfort right facial region. The patient tells me that she's had a couple of these spells at least in the past. Again, she describes significant discomfort pulling sensation along with tingling involving the right facial region and radiates into the right upper extremity. The symptoms lasted several hours on the day of admission. In fact, she is not totally back to normal although she has improved dramatically.  She is awake and alert. She is lucid and coherent. Pupils are reactive and visual fields are intact. Extraocular movements are full. Facial muscle strength symmetric. Strength is normal throughout and there is no pronator drift. Coordination is normal.  The patient's brain MRI is reviewed in person and there are no white matter lesions. There isb increased hypertrophy in intensity seen on flare and diffusion imaging involving the trigone area bilaterally especially on the left side. It seems to have this increased signal involving the ventricular region. It's unclear this is artifact or not.     Objective: Vital signs in last 24 hours: Temp:  [97.8 F (36.6 C)-98.4 F (36.9 C)] 98.4 F (36.9 C) (10/28 1828) Pulse Rate:  [62-77] 70 (10/28 1828) Resp:  [16-18] 18 (10/28 1828) BP: (110-134)/(74-78) 110/74 mmHg (10/28 1828) SpO2:  [99 %-100 %] 100 % (10/28 1828)  Intake/Output from previous  day: 10/27 0701 - 10/28 0700 In: 460 [P.O.:460] Out: -  Intake/Output this shift:   Nutritional status: Sodium Restricted   Lab Results: Results for orders placed during the hospital encounter of 01/21/13 (from the past 48 hour(s))  MRSA PCR SCREENING     Status: None   Collection Time    01/21/13  1:30 AM      Result Value Range   MRSA by PCR NEGATIVE  NEGATIVE   Comment:            The GeneXpert MRSA Assay (FDA     approved for NASAL specimens     only), is one component of a     comprehensive MRSA colonization     surveillance program. It is not     intended to diagnose MRSA     infection nor to guide or     monitor treatment for     MRSA infections.  GLUCOSE, CAPILLARY     Status: None   Collection Time    01/21/13  1:51 AM      Result Value Range   Glucose-Capillary 92  70 - 99 mg/dL   Comment 1 Documented in Chart     Comment 2 Notify RN    ETHANOL     Status: None   Collection Time    01/21/13  2:00 AM      Result Value Range   Alcohol, Ethyl (B) <11  0 - 11 mg/dL   Comment:            LOWEST DETECTABLE  LIMIT FOR     SERUM ALCOHOL IS 11 mg/dL     FOR MEDICAL PURPOSES ONLY  PROTIME-INR     Status: None   Collection Time    01/21/13  2:00 AM      Result Value Range   Prothrombin Time 13.7  11.6 - 15.2 seconds   INR 1.07  0.00 - 1.49  APTT     Status: None   Collection Time    01/21/13  2:00 AM      Result Value Range   aPTT 30  24 - 37 seconds  CBC     Status: Abnormal   Collection Time    01/21/13  2:00 AM      Result Value Range   WBC 7.6  4.0 - 10.5 K/uL   RBC 5.92 (*) 3.87 - 5.11 MIL/uL   Hemoglobin 14.5  12.0 - 15.0 g/dL   HCT 16.1  09.6 - 04.5 %   MCV 74.7 (*) 78.0 - 100.0 fL   MCH 24.5 (*) 26.0 - 34.0 pg   MCHC 32.8  30.0 - 36.0 g/dL   RDW 40.9  81.1 - 91.4 %   Platelets 168  150 - 400 K/uL  DIFFERENTIAL     Status: None   Collection Time    01/21/13  2:00 AM      Result Value Range   Neutrophils Relative % 52  43 - 77 %   Neutro Abs  4.0  1.7 - 7.7 K/uL   Lymphocytes Relative 38  12 - 46 %   Lymphs Abs 2.9  0.7 - 4.0 K/uL   Monocytes Relative 7  3 - 12 %   Monocytes Absolute 0.6  0.1 - 1.0 K/uL   Eosinophils Relative 2  0 - 5 %   Eosinophils Absolute 0.2  0.0 - 0.7 K/uL   Basophils Relative 0  0 - 1 %   Basophils Absolute 0.0  0.0 - 0.1 K/uL  TROPONIN I     Status: None   Collection Time    01/21/13  2:00 AM      Result Value Range   Troponin I <0.30  <0.30 ng/mL   Comment:            Due to the release kinetics of cTnI,     a negative result within the first hours     of the onset of symptoms does not rule out     myocardial infarction with certainty.     If myocardial infarction is still suspected,     repeat the test at appropriate intervals.  COMPREHENSIVE METABOLIC PANEL     Status: Abnormal   Collection Time    01/21/13  2:00 AM      Result Value Range   Sodium 134 (*) 135 - 145 mEq/L   Potassium 3.6  3.5 - 5.1 mEq/L   Chloride 98  96 - 112 mEq/L   CO2 26  19 - 32 mEq/L   Glucose, Bld 100 (*) 70 - 99 mg/dL   BUN 12  6 - 23 mg/dL   Creatinine, Ser 7.82  0.50 - 1.10 mg/dL   Calcium 9.3  8.4 - 95.6 mg/dL   Total Protein 8.1  6.0 - 8.3 g/dL   Albumin 4.2  3.5 - 5.2 g/dL   AST 24  0 - 37 U/L   ALT 24  0 - 35 U/L   Alkaline Phosphatase 94  39 - 117 U/L   Total Bilirubin  0.3  0.3 - 1.2 mg/dL   GFR calc non Af Amer 80 (*) >90 mL/min   GFR calc Af Amer >90  >90 mL/min   Comment: (NOTE)     The eGFR has been calculated using the CKD EPI equation.     This calculation has not been validated in all clinical situations.     eGFR's persistently <90 mL/min signify possible Chronic Kidney     Disease.  URINE RAPID DRUG SCREEN (HOSP PERFORMED)     Status: None   Collection Time    01/21/13  2:09 AM      Result Value Range   Opiates NONE DETECTED  NONE DETECTED   Cocaine NONE DETECTED  NONE DETECTED   Benzodiazepines NONE DETECTED  NONE DETECTED   Amphetamines NONE DETECTED  NONE DETECTED    Tetrahydrocannabinol NONE DETECTED  NONE DETECTED   Barbiturates NONE DETECTED  NONE DETECTED   Comment:            DRUG SCREEN FOR MEDICAL PURPOSES     ONLY.  IF CONFIRMATION IS NEEDED     FOR ANY PURPOSE, NOTIFY LAB     WITHIN 5 DAYS.                LOWEST DETECTABLE LIMITS     FOR URINE DRUG SCREEN     Drug Class       Cutoff (ng/mL)     Amphetamine      1000     Barbiturate      200     Benzodiazepine   200     Tricyclics       300     Opiates          300     Cocaine          300     THC              50  URINALYSIS, ROUTINE W REFLEX MICROSCOPIC     Status: None   Collection Time    01/21/13  2:09 AM      Result Value Range   Color, Urine YELLOW  YELLOW   APPearance CLEAR  CLEAR   Specific Gravity, Urine 1.015  1.005 - 1.030   pH 6.0  5.0 - 8.0   Glucose, UA NEGATIVE  NEGATIVE mg/dL   Hgb urine dipstick NEGATIVE  NEGATIVE   Bilirubin Urine NEGATIVE  NEGATIVE   Ketones, ur NEGATIVE  NEGATIVE mg/dL   Protein, ur NEGATIVE  NEGATIVE mg/dL   Urobilinogen, UA 0.2  0.0 - 1.0 mg/dL   Nitrite NEGATIVE  NEGATIVE   Leukocytes, UA NEGATIVE  NEGATIVE   Comment: MICROSCOPIC NOT DONE ON URINES WITH NEGATIVE PROTEIN, BLOOD, LEUKOCYTES, NITRITE, OR GLUCOSE <1000 mg/dL.  HEMOGLOBIN A1C     Status: Abnormal   Collection Time    01/21/13  5:19 AM      Result Value Range   Hemoglobin A1C 5.7 (*) <5.7 %   Comment: (NOTE)                                                                               According to the ADA Clinical Practice Recommendations  for 2011, when     HbA1c is used as a screening test:      >=6.5%   Diagnostic of Diabetes Mellitus               (if abnormal result is confirmed)     5.7-6.4%   Increased risk of developing Diabetes Mellitus     References:Diagnosis and Classification of Diabetes Mellitus,Diabetes     Care,2011,34(Suppl 1):S62-S69 and Standards of Medical Care in             Diabetes - 2011,Diabetes Care,2011,34 (Suppl 1):S11-S61.   Mean Plasma Glucose  117 (*) <117 mg/dL   Comment: Performed at [facility]  LIPID PANEL     Status: None   Collection Time    01/21/13  5:19 AM      Result Value Range   Cholesterol 155  0 - 200 mg/dL   Triglycerides 60  <161 mg/dL   HDL 48  >09 mg/dL   Total CHOL/HDL Ratio 3.2     VLDL 12  0 - 40 mg/dL   LDL Cholesterol 95  0 - 99 mg/dL   Comment:            Total Cholesterol/HDL:CHD Risk     Coronary Heart Disease Risk Table                         Men   Women      1/2 Average Risk   3.4   3.3      Average Risk       5.0   4.4      2 X Average Risk   9.6   7.1      3 X Average Risk  23.4   11.0                Use the calculated Patient Ratio     above and the CHD Risk Table     to determine the patient's CHD Risk.                ATP III CLASSIFICATION (LDL):      <100     mg/dL   Optimal      604-540  mg/dL   Near or Above                        Optimal      130-159  mg/dL   Borderline      981-191  mg/dL   High      >478     mg/dL   Very High  VITAMIN G95     Status: None   Collection Time    01/21/13  9:30 AM      Result Value Range   Vitamin B-12 720  211 - 911 pg/mL   Comment: Performed at [facility]  TSH     Status: None   Collection Time    01/21/13  9:30 AM      Result Value Range   TSH 1.498  0.350 - 4.500 uIU/mL   Comment: Performed at [facility]  HOMOCYSTEINE     Status: None   Collection Time    01/21/13  9:30 AM      Result Value Range   Homocysteine 10.0  4.0 - 15.4 umol/L   Comment: Performed at [facility]  RPR     Status: None   Collection  Time    01/21/13  9:30 AM      Result Value Range   RPR NON REACTIVE  NON REACTIVE   Comment: Performed at [facility]    Lipid Panel  Recent Labs  01/21/13 0519  CHOL 155  TRIG 60  HDL 48  CHOLHDL 3.2  VLDL 12  LDLCALC 95    Studies/Results: Dg Chest 2 View  01/21/2013   CLINICAL DATA:  Stroke. Cough. History of asthma.  EXAM: CHEST  2 VIEW  COMPARISON:  None.   FINDINGS: Pectus excavatum deformity is present. Cardiopericardial silhouette and mediastinal contours appear within normal limits. No airspace disease or effusion. Monitoring leads project over the chest.  IMPRESSION: No active cardiopulmonary disease.   Electronically Signed   By: Andreas Newport M.D.   On: 01/21/2013 14:43   Ct Head Wo Contrast  01/21/2013   CLINICAL DATA:  Hypertension, right-sided weakness.  EXAM: CT HEAD WITHOUT CONTRAST  TECHNIQUE: Contiguous axial images were obtained from the base of the skull through the vertex without intravenous contrast.  COMPARISON:  CT of head October 12, 2011  FINDINGS: The ventricles and sulci are normal. No intraparenchymal hemorrhage, mass effect nor midline shift. No acute large vascular territory infarcts.  No abnormal extra-axial fluid collections. Basal cisterns are patent.  No skull fracture. Visualized paranasal sinuses and mastoid air-cells are well-aerated. The included ocular globes and orbital contents are non-suspicious.  IMPRESSION: No acute intracranial process ; normal noncontrast CT of the head. If clinical concern for acute ischemia, MRI of the brain with diffusion-weighted sequences would be more sensitive.   Electronically Signed   By: Awilda Metro   On: 01/21/2013 01:56   Mr Maxine Glenn Head Wo Contrast  01/21/2013   CLINICAL DATA:  Hypertensive 42 year old female presenting with right facial numbness.  EXAM: MRI HEAD WITHOUT CONTRAST  MRA HEAD WITHOUT CONTRAST  TECHNIQUE: Multiplanar, multiecho pulse sequences of the brain and surrounding structures were obtained without intravenous contrast. Angiographic images of the head were obtained using MRA technique without contrast.  COMPARISON:  01/21/2013 head CT. No comparison brain MR.  FINDINGS: MRI HEAD FINDINGS  No acute infarct.  No intracranial hemorrhage.  No hydrocephalus.  Left left-sided choroid plexus cyst fell most likely to represent an incidental finding.  Right cerebellar tonsil  minimally low lying although within the range of normal limits.  MRA HEAD FINDINGS  Anterior circulation without medium or large size vessel significant stenosis or occlusion.  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  Middle cerebral artery Mild branch vessel irregularity.  Ectatic vertebral arteries and basilar artery without significant narrowing.  Nonvisualization AICAs  IMPRESSION: MRI HEAD IMPRESSION  No acute infarct. Please see above.  MRA HEAD IMPRESSION  No medium or large size vessel significant stenosis or occlusion.   Electronically Signed   By: Bridgett Larsson M.D.   On: 01/21/2013 12:50   Mr Brain Wo Contrast  01/21/2013   CLINICAL DATA:  Hypertensive 41 year old female presenting with right facial numbness.  EXAM: MRI HEAD WITHOUT CONTRAST  MRA HEAD WITHOUT CONTRAST  TECHNIQUE: Multiplanar, multiecho pulse sequences of the brain and surrounding structures were obtained without intravenous contrast. Angiographic images of the head were obtained using MRA technique without contrast.  COMPARISON:  01/21/2013 head CT. No comparison brain MR.  FINDINGS: MRI HEAD FINDINGS  No acute infarct.  No intracranial hemorrhage.  No hydrocephalus.  Left left-sided choroid plexus cyst fell most likely to  represent an incidental finding.  Right cerebellar tonsil minimally low lying although within the range of normal limits.  MRA HEAD FINDINGS  Anterior circulation without medium or large size vessel significant stenosis or occlusion.  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  Middle cerebral artery Mild branch vessel irregularity.  Ectatic vertebral arteries and basilar artery without significant narrowing.  Nonvisualization AICAs  IMPRESSION: MRI HEAD IMPRESSION  No acute infarct. Please see above.  MRA HEAD IMPRESSION  No medium or large size vessel significant stenosis or occlusion.   Electronically Signed    By: Bridgett Larsson M.D.   On: 01/21/2013 12:50   US Carotid Duplex Bilateral  01/21/2013   CLINICAL DATA:  Hypertension, right-sided weakness, possible stroke  EXAM: BILATERAL CAROTID DUPLEX ULTRASOUND  TECHNIQUE: Wallace Cullens scale imaging, color Doppler and duplex ultrasound were performed of bilateral carotid and vertebral arteries in the neck.  COMPARISON:  None.  FINDINGS: Criteria: Quantification of carotid stenosis is based on velocity parameters that correlate the residual internal carotid diameter with NASCET-based stenosis levels, using the diameter of the distal internal carotid lumen as the denominator for stenosis measurement.  The following velocity measurements were obtained:  RIGHT  ICA:  95/31 cm/sec  CCA:  74/18 cm/sec  SYSTOLIC ICA/CCA RATIO:  1.28  DIASTOLIC ICA/CCA RATIO:  1.71  ECA:  93 cm/sec  LEFT  ICA:  113/38 cm/sec  CCA:  78/19 cm/sec  SYSTOLIC ICA/CCA RATIO:  1.45  DIASTOLIC ICA/CCA RATIO:  2.02  ECA:  72 cm/sec  RIGHT CAROTID ARTERY: No significant atherosclerosis. No hemodynamically significant right ICA stenosis, velocity elevation, or turbulent flow.  RIGHT VERTEBRAL ARTERY:  Antegrade  LEFT CAROTID ARTERY: No significant atherosclerosis. No hemodynamically significant left ICA stenosis, velocity elevation, or turbulent flow.  LEFT VERTEBRAL ARTERY:  Antegrade  IMPRESSION: No significant carotid atherosclerosis or ICA stenosis by ultrasound   Electronically Signed   By: Ruel Favors M.D.   On: 01/21/2013 10:39    Medications:  Scheduled Meds: . aspirin  300 mg Rectal Daily   Or  . aspirin  325 mg Oral Daily  . atenolol  50 mg Oral Daily  . enoxaparin (LOVENOX) injection  40 mg Subcutaneous Q24H   Continuous Infusions:  PRN Meds:.albuterol, senna-docusate     LOS: 1 day   Robin Fuentes A. Gerilyn Fuentes, M.D.  Diplomate, Biomedical engineer of Psychiatry and Neurology ( Neurology).

## 2013-01-22 NOTE — Progress Notes (Signed)
Subjective: Patient feels better. However, still she has numbness around her lip. No headache or fever. She is being evaluated by neurology.  Objective: Vital signs in last 24 hours: Temp:  [97.8 F (36.6 C)-98.5 F (36.9 C)] 97.8 F (36.6 C) (10/28 0457) Pulse Rate:  [57-77] 77 (10/28 0457) Resp:  [16-18] 16 (10/28 0457) BP: (113-160)/(76-100) 134/78 mmHg (10/28 0457) SpO2:  [98 %-100 %] 100 % (10/28 0457) Weight:  [73.62 kg (162 lb 4.8 oz)] 73.62 kg (162 lb 4.8 oz) (10/27 1509) Weight change: 13.745 kg (30 lb 4.8 oz) Last BM Date: 01/20/13  Intake/Output from previous day: 10/27 0701 - 10/28 0700 In: 460 [P.O.:460] Out: -   PHYSICAL EXAM General appearance: alert and no distress Resp: clear to auscultation bilaterally Cardio: S1, S2 normal GI: soft, non-tender; bowel sounds normal; no masses,  no organomegaly Extremities: extremities normal, atraumatic, no cyanosis or edema  Lab Results:    @labtest @ [facility] No results found for this basename: PHART, PCO2, PO2ART, TCO2, HCO3,  in the last 72 hours CULTURES Recent Results (from the past 240 hour(s))  MRSA PCR SCREENING     Status: None   Collection Time    01/21/13  1:30 AM      Result Value Range Status   MRSA by PCR NEGATIVE  NEGATIVE Final   Comment:            The GeneXpert MRSA Assay (FDA     approved for NASAL specimens     only), is one component of a     comprehensive MRSA colonization     surveillance program. It is not     intended to diagnose MRSA     infection nor to guide or     monitor treatment for     MRSA infections.   Studies/Results: Dg Chest 2 View  01/21/2013   CLINICAL DATA:  Stroke. Cough. History of asthma.  EXAM: CHEST  2 VIEW  COMPARISON:  None.  FINDINGS: Pectus excavatum deformity is present. Cardiopericardial silhouette and mediastinal contours appear within normal limits. No airspace disease or effusion. Monitoring leads project over the chest.  IMPRESSION: No active cardiopulmonary  disease.   Electronically Signed   By: Andreas Newport M.D.   On: 01/21/2013 14:43   Ct Head Wo Contrast  01/21/2013   CLINICAL DATA:  Hypertension, right-sided weakness.  EXAM: CT HEAD WITHOUT CONTRAST  TECHNIQUE: Contiguous axial images were obtained from the base of the skull through the vertex without intravenous contrast.  COMPARISON:  CT of head October 12, 2011  FINDINGS: The ventricles and sulci are normal. No intraparenchymal hemorrhage, mass effect nor midline shift. No acute large vascular territory infarcts.  No abnormal extra-axial fluid collections. Basal cisterns are patent.  No skull fracture. Visualized paranasal sinuses and mastoid air-cells are well-aerated. The included ocular globes and orbital contents are non-suspicious.  IMPRESSION: No acute intracranial process ; normal noncontrast CT of the head. If clinical concern for acute ischemia, MRI of the brain with diffusion-weighted sequences would be more sensitive.   Electronically Signed   By: Awilda Metro   On: 01/21/2013 01:56   Mr Maxine Glenn Head Wo Contrast  01/21/2013   CLINICAL DATA:  Hypertensive 42 year old female presenting with right facial numbness.  EXAM: MRI HEAD WITHOUT CONTRAST  MRA HEAD WITHOUT CONTRAST  TECHNIQUE: Multiplanar, multiecho pulse sequences of the brain and surrounding structures were obtained without intravenous contrast. Angiographic images of the head were obtained using MRA technique without contrast.  COMPARISON:  01/21/2013 head CT. No comparison brain MR.  FINDINGS: MRI HEAD FINDINGS  No acute infarct.  No intracranial hemorrhage.  No hydrocephalus.  Left left-sided choroid plexus cyst fell most likely to represent an incidental finding.  Right cerebellar tonsil minimally low lying although within the range of normal limits.  MRA HEAD FINDINGS  Anterior circulation without medium or large size vessel significant stenosis or occlusion.  Duplicated A1 segment left anterior cerebral artery/ fenestration  without associated aneurysm.  Middle cerebral artery Mild branch vessel irregularity.  Ectatic vertebral arteries and basilar artery without significant narrowing.  Nonvisualization AICAs  IMPRESSION: MRI HEAD IMPRESSION  No acute infarct. Please see above.  MRA HEAD IMPRESSION  No medium or large size vessel significant stenosis or occlusion.   Electronically Signed   By: Bridgett Larsson M.D.   On: 01/21/2013 12:50   Mr Brain Wo Contrast  01/21/2013   CLINICAL DATA:  Hypertensive 42 year old female presenting with right facial numbness.  EXAM: MRI HEAD WITHOUT CONTRAST  MRA HEAD WITHOUT CONTRAST  TECHNIQUE: Multiplanar, multiecho pulse sequences of the brain and surrounding structures were obtained without intravenous contrast. Angiographic images of the head were obtained using MRA technique without contrast.  COMPARISON:  01/21/2013 head CT. No comparison brain MR.  FINDINGS: MRI HEAD FINDINGS  No acute infarct.  No intracranial hemorrhage.  No hydrocephalus.  Left left-sided choroid plexus cyst fell most likely to represent an incidental finding.  Right cerebellar tonsil minimally low lying although within the range of normal limits.  MRA HEAD FINDINGS  Anterior circulation without medium or large size vessel significant stenosis or occlusion.  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  Middle cerebral artery Mild branch vessel irregularity.  Ectatic vertebral arteries and basilar artery without significant narrowing.  Nonvisualization AICAs  IMPRESSION: MRI HEAD IMPRESSION  No acute infarct. Please see above.  MRA HEAD IMPRESSION  No medium or large size vessel significant stenosis or occlusion.   Electronically Signed   By: Bridgett Larsson M.D.   On: 01/21/2013 12:50   US Carotid Duplex Bilateral  01/21/2013   CLINICAL DATA:  Hypertension, right-sided weakness, possible stroke  EXAM: BILATERAL CAROTID DUPLEX ULTRASOUND  TECHNIQUE: Wallace Cullens scale imaging, color  Doppler and duplex ultrasound were performed of bilateral carotid and vertebral arteries in the neck.  COMPARISON:  None.  FINDINGS: Criteria: Quantification of carotid stenosis is based on velocity parameters that correlate the residual internal carotid diameter with NASCET-based stenosis levels, using the diameter of the distal internal carotid lumen as the denominator for stenosis measurement.  The following velocity measurements were obtained:  RIGHT  ICA:  95/31 cm/sec  CCA:  74/18 cm/sec  SYSTOLIC ICA/CCA RATIO:  1.28  DIASTOLIC ICA/CCA RATIO:  1.71  ECA:  93 cm/sec  LEFT  ICA:  113/38 cm/sec  CCA:  78/19 cm/sec  SYSTOLIC ICA/CCA RATIO:  1.45  DIASTOLIC ICA/CCA RATIO:  2.02  ECA:  72 cm/sec  RIGHT CAROTID ARTERY: No significant atherosclerosis. No hemodynamically significant right ICA stenosis, velocity elevation, or turbulent flow.  RIGHT VERTEBRAL ARTERY:  Antegrade  LEFT CAROTID ARTERY: No significant atherosclerosis. No hemodynamically significant left ICA stenosis, velocity elevation, or turbulent flow.  LEFT VERTEBRAL ARTERY:  Antegrade  IMPRESSION: No significant carotid atherosclerosis or ICA stenosis by ultrasound   Electronically Signed   By: Ruel Favors M.D.   On: 01/21/2013 10:39    Medications: I have reviewed the patient's current medications.  Assesment: Active Problems:  Right facial numbness   HTN (hypertension)    Plan: Will continue telemetry Will do neurology consult appreciated. Continue as per neurology recommendation.    LOS: 1 day   Kobe Ofallon 01/22/2013, 8:30 AM

## 2013-01-23 ENCOUNTER — Inpatient Hospital Stay (HOSPITAL_COMMUNITY)
Admit: 2013-01-23 | Discharge: 2013-01-23 | Disposition: A | Discharge status: Home / Self Care | Payer: BC Managed Care – PPO | Attending: Neurology | Admitting: Neurology

## 2013-01-23 NOTE — Progress Notes (Signed)
Subjective: Patient feels better. The numbness has resolved. She is planned for EEG. Objective: Vital signs in last 24 hours: Temp:  [97.8 F (36.6 C)-98.4 F (36.9 C)] 97.8 F (36.6 C) (10/29 0522) Pulse Rate:  [65-74] 72 (10/29 0522) Resp:  [18] 18 (10/29 0522) BP: (91-128)/(57-75) 114/57 mmHg (10/29 0522) SpO2:  [98 %-100 %] 98 % (10/29 0522) Weight change:  Last BM Date: 01/22/13  Intake/Output from previous day: 10/28 0701 - 10/29 0700 In: 600 [P.O.:600] Out: 3 [Urine:3]  PHYSICAL EXAM General appearance: alert and no distress Resp: clear to auscultation bilaterally Cardio: S1, S2 normal GI: soft, non-tender; bowel sounds normal; no masses,  no organomegaly Extremities: extremities normal, atraumatic, no cyanosis or edema  Lab Results:    @labtest @ [facility] No results found for this basename: PHART, PCO2, PO2ART, TCO2, HCO3,  in the last 72 hours CULTURES Recent Results (from the past 240 hour(s))  MRSA PCR SCREENING     Status: None   Collection Time    01/21/13  1:30 AM      Result Value Range Status   MRSA by PCR NEGATIVE  NEGATIVE Final   Comment:            The GeneXpert MRSA Assay (FDA     approved for NASAL specimens     only), is one component of a     comprehensive MRSA colonization     surveillance program. It is not     intended to diagnose MRSA     infection nor to guide or     monitor treatment for     MRSA infections.   Studies/Results: Dg Chest 2 View  01/21/2013   CLINICAL DATA:  Stroke. Cough. History of asthma.  EXAM: CHEST  2 VIEW  COMPARISON:  None.  FINDINGS: Pectus excavatum deformity is present. Cardiopericardial silhouette and mediastinal contours appear within normal limits. No airspace disease or effusion. Monitoring leads project over the chest.  IMPRESSION: No active cardiopulmonary disease.   Electronically Signed   By: Andreas Newport M.D.   On: 01/21/2013 14:43   Mr Maxine Glenn Head Wo Contrast  01/21/2013   CLINICAL DATA:   Hypertensive 42 year old female presenting with right facial numbness.  EXAM: MRI HEAD WITHOUT CONTRAST  MRA HEAD WITHOUT CONTRAST  TECHNIQUE: Multiplanar, multiecho pulse sequences of the brain and surrounding structures were obtained without intravenous contrast. Angiographic images of the head were obtained using MRA technique without contrast.  COMPARISON:  01/21/2013 head CT. No comparison brain MR.  FINDINGS: MRI HEAD FINDINGS  No acute infarct.  No intracranial hemorrhage.  No hydrocephalus.  Left left-sided choroid plexus cyst fell most likely to represent an incidental finding.  Right cerebellar tonsil minimally low lying although within the range of normal limits.  MRA HEAD FINDINGS  Anterior circulation without medium or large size vessel significant stenosis or occlusion.  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  Middle cerebral artery Mild branch vessel irregularity.  Ectatic vertebral arteries and basilar artery without significant narrowing.  Nonvisualization AICAs  IMPRESSION: MRI HEAD IMPRESSION  No acute infarct. Please see above.  MRA HEAD IMPRESSION  No medium or large size vessel significant stenosis or occlusion.   Electronically Signed   By: Bridgett Larsson M.D.   On: 01/21/2013 12:50   Mr Brain Wo Contrast  01/21/2013   CLINICAL DATA:  Hypertensive 42 year old female presenting with right facial numbness.  EXAM: MRI HEAD WITHOUT CONTRAST  MRA HEAD  WITHOUT CONTRAST  TECHNIQUE: Multiplanar, multiecho pulse sequences of the brain and surrounding structures were obtained without intravenous contrast. Angiographic images of the head were obtained using MRA technique without contrast.  COMPARISON:  01/21/2013 head CT. No comparison brain MR.  FINDINGS: MRI HEAD FINDINGS  No acute infarct.  No intracranial hemorrhage.  No hydrocephalus.  Left left-sided choroid plexus cyst fell most likely to represent an incidental finding.  Right cerebellar  tonsil minimally low lying although within the range of normal limits.  MRA HEAD FINDINGS  Anterior circulation without medium or large size vessel significant stenosis or occlusion.  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  Middle cerebral artery Mild branch vessel irregularity.  Ectatic vertebral arteries and basilar artery without significant narrowing.  Nonvisualization AICAs  IMPRESSION: MRI HEAD IMPRESSION  No acute infarct. Please see above.  MRA HEAD IMPRESSION  No medium or large size vessel significant stenosis or occlusion.   Electronically Signed   By: Bridgett Larsson M.D.   On: 01/21/2013 12:50   US Carotid Duplex Bilateral  01/21/2013   CLINICAL DATA:  Hypertension, right-sided weakness, possible stroke  EXAM: BILATERAL CAROTID DUPLEX ULTRASOUND  TECHNIQUE: Wallace Cullens scale imaging, color Doppler and duplex ultrasound were performed of bilateral carotid and vertebral arteries in the neck.  COMPARISON:  None.  FINDINGS: Criteria: Quantification of carotid stenosis is based on velocity parameters that correlate the residual internal carotid diameter with NASCET-based stenosis levels, using the diameter of the distal internal carotid lumen as the denominator for stenosis measurement.  The following velocity measurements were obtained:  RIGHT  ICA:  95/31 cm/sec  CCA:  74/18 cm/sec  SYSTOLIC ICA/CCA RATIO:  1.28  DIASTOLIC ICA/CCA RATIO:  1.71  ECA:  93 cm/sec  LEFT  ICA:  113/38 cm/sec  CCA:  78/19 cm/sec  SYSTOLIC ICA/CCA RATIO:  1.45  DIASTOLIC ICA/CCA RATIO:  2.02  ECA:  72 cm/sec  RIGHT CAROTID ARTERY: No significant atherosclerosis. No hemodynamically significant right ICA stenosis, velocity elevation, or turbulent flow.  RIGHT VERTEBRAL ARTERY:  Antegrade  LEFT CAROTID ARTERY: No significant atherosclerosis. No hemodynamically significant left ICA stenosis, velocity elevation, or turbulent flow.  LEFT VERTEBRAL ARTERY:  Antegrade  IMPRESSION: No  significant carotid atherosclerosis or ICA stenosis by ultrasound   Electronically Signed   By: Ruel Favors M.D.   On: 01/21/2013 10:39    Medications: I have reviewed the patient's current medications.  Assesment: Active Problems:   Right facial numbness   HTN (hypertension)    Plan: Will continue telemetry Continue as per neurology recommendation. Current treatment    LOS: 2 days   Beanca Kiester 01/23/2013, 8:05 AM

## 2013-01-23 NOTE — Care Management Note (Addendum)
    Page 1 of 1   01/24/2013     8:08:43 AM   CARE MANAGEMENT NOTE 01/24/2013  Patient:  Robin Fuentes, Robin Fuentes   Account Number:  [id number]  Date Initiated:  01/23/2013  Documentation initiated by:  Sharrie Rothman  Subjective/Objective Assessment:   Pt admitted from home with possible CVA. Pt lives with her mother and husband and will return home at discharge. Pt is independent with ADL's.     Action/Plan:   No CM needs noted.   Anticipated DC Date:  01/24/2013   Anticipated DC Plan:  HOME/SELF CARE      DC Planning Services  CM consult      Choice offered to / List presented to:             Status of service:  Completed, signed off Medicare Important Message given?   (If response is "NO", the following Medicare IM given date fields will be blank) Date Medicare IM given:   Date Additional Medicare IM given:    Discharge Disposition:  HOME/SELF CARE  Per UR Regulation:    If discussed at Long Length of Stay Meetings, dates discussed:    Comments:  01/24/13 0807 Arlyss Queen, RN BSN CM Pt discharged today. No CM needs noted.  01/23/13 1045 Arlyss Queen, Charity fundraiser BSN CM

## 2013-01-23 NOTE — Progress Notes (Signed)
EEG Completed; Results Pending  

## 2013-01-24 MED ORDER — ASPIRIN 300 MG RE SUPP: 300.0000 mg | Freq: Every day | RECTAL | Status: DC

## 2013-01-24 MED ORDER — KETOCONAZOLE 2 % EX SHAM: MEDICATED_SHAMPOO | CUTANEOUS | Status: DC

## 2013-01-24 NOTE — Discharge Summary (Signed)
Physician Discharge Summary  Patient ID: Robin Fuentes MRN: [id number] DOB/AGE: [birth year] 42 y.o. Primary Care Physician:Katelynne Revak, MD Admit date: 01/21/2013 Discharge date: 01/24/2013    Discharge Diagnoses:  Active Problems:   Right facial numbness   HTN (hypertension)     Medication List         aspirin 300 MG suppository  Place 1 suppository (300 mg total) rectally daily.     atenolol 50 MG tablet  Commonly known as:  TENORMIN  Take 50 mg by mouth daily.     ketoconazole 2 % shampoo  Commonly known as:  NIZORAL  Apply topically 2 (two) times a week.     PROAIR HFA 108 (90 BASE) MCG/ACT inhaler  Generic drug:  albuterol  Inhale 2 puffs into the lungs every 6 (six) hours as needed.        Discharged Condition: improved    Consults: neurology  Significant Diagnostic Studies: Dg Chest 2 View  01/21/2013   CLINICAL DATA:  Stroke. Cough. History of asthma.  EXAM: CHEST  2 VIEW  COMPARISON:  None.  FINDINGS: Pectus excavatum deformity is present. Cardiopericardial silhouette and mediastinal contours appear within normal limits. No airspace disease or effusion. Monitoring leads project over the chest.  IMPRESSION: No active cardiopulmonary disease.   Electronically Signed   By: Andreas Newport M.D.   On: 01/21/2013 14:43   Ct Head Wo Contrast  01/21/2013   CLINICAL DATA:  Hypertension, right-sided weakness.  EXAM: CT HEAD WITHOUT CONTRAST  TECHNIQUE: Contiguous axial images were obtained from the base of the skull through the vertex without intravenous contrast.  COMPARISON:  CT of head October 12, 2011  FINDINGS: The ventricles and sulci are normal. No intraparenchymal hemorrhage, mass effect nor midline shift. No acute large vascular territory infarcts.  No abnormal extra-axial fluid collections. Basal cisterns are patent.  No skull fracture. Visualized paranasal sinuses and mastoid air-cells are well-aerated. The included ocular globes and orbital contents are  non-suspicious.  IMPRESSION: No acute intracranial process ; normal noncontrast CT of the head. If clinical concern for acute ischemia, MRI of the brain with diffusion-weighted sequences would be more sensitive.   Electronically Signed   By: Awilda Metro   On: 01/21/2013 01:56   Mr Maxine Glenn Head Wo Contrast  01/21/2013   CLINICAL DATA:  Hypertensive 42 year old female presenting with right facial numbness.  EXAM: MRI HEAD WITHOUT CONTRAST  MRA HEAD WITHOUT CONTRAST  TECHNIQUE: Multiplanar, multiecho pulse sequences of the brain and surrounding structures were obtained without intravenous contrast. Angiographic images of the head were obtained using MRA technique without contrast.  COMPARISON:  01/21/2013 head CT. No comparison brain MR.  FINDINGS: MRI HEAD FINDINGS  No acute infarct.  No intracranial hemorrhage.  No hydrocephalus.  Left left-sided choroid plexus cyst fell most likely to represent an incidental finding.  Right cerebellar tonsil minimally low lying although within the range of normal limits.  MRA HEAD FINDINGS  Anterior circulation without medium or large size vessel significant stenosis or occlusion.  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  Middle cerebral artery Mild branch vessel irregularity.  Ectatic vertebral arteries and basilar artery without significant narrowing.  Nonvisualization AICAs  IMPRESSION: MRI HEAD IMPRESSION  No acute infarct. Please see above.  MRA HEAD IMPRESSION  No medium or large size vessel significant stenosis or occlusion.   Electronically Signed   By: Bridgett Larsson M.D.   On: 01/21/2013 12:50   Mr Brain Ilda Basset  Contrast  01/21/2013   CLINICAL DATA:  Hypertensive 42 year old female presenting with right facial numbness.  EXAM: MRI HEAD WITHOUT CONTRAST  MRA HEAD WITHOUT CONTRAST  TECHNIQUE: Multiplanar, multiecho pulse sequences of the brain and surrounding structures were obtained without intravenous contrast.  Angiographic images of the head were obtained using MRA technique without contrast.  COMPARISON:  01/21/2013 head CT. No comparison brain MR.  FINDINGS: MRI HEAD FINDINGS  No acute infarct.  No intracranial hemorrhage.  No hydrocephalus.  Left left-sided choroid plexus cyst fell most likely to represent an incidental finding.  Right cerebellar tonsil minimally low lying although within the range of normal limits.  MRA HEAD FINDINGS  Anterior circulation without medium or large size vessel significant stenosis or occlusion.  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  Middle cerebral artery Mild branch vessel irregularity.  Ectatic vertebral arteries and basilar artery without significant narrowing.  Nonvisualization AICAs  IMPRESSION: MRI HEAD IMPRESSION  No acute infarct. Please see above.  MRA HEAD IMPRESSION  No medium or large size vessel significant stenosis or occlusion.   Electronically Signed   By: Bridgett Larsson M.D.   On: 01/21/2013 12:50   US Carotid Duplex Bilateral  01/21/2013   CLINICAL DATA:  Hypertension, right-sided weakness, possible stroke  EXAM: BILATERAL CAROTID DUPLEX ULTRASOUND  TECHNIQUE: Wallace Cullens scale imaging, color Doppler and duplex ultrasound were performed of bilateral carotid and vertebral arteries in the neck.  COMPARISON:  None.  FINDINGS: Criteria: Quantification of carotid stenosis is based on velocity parameters that correlate the residual internal carotid diameter with NASCET-based stenosis levels, using the diameter of the distal internal carotid lumen as the denominator for stenosis measurement.  The following velocity measurements were obtained:  RIGHT  ICA:  95/31 cm/sec  CCA:  74/18 cm/sec  SYSTOLIC ICA/CCA RATIO:  1.28  DIASTOLIC ICA/CCA RATIO:  1.71  ECA:  93 cm/sec  LEFT  ICA:  113/38 cm/sec  CCA:  78/19 cm/sec  SYSTOLIC ICA/CCA RATIO:  1.45  DIASTOLIC ICA/CCA RATIO:  2.02  ECA:  72 cm/sec  RIGHT CAROTID ARTERY: No  significant atherosclerosis. No hemodynamically significant right ICA stenosis, velocity elevation, or turbulent flow.  RIGHT VERTEBRAL ARTERY:  Antegrade  LEFT CAROTID ARTERY: No significant atherosclerosis. No hemodynamically significant left ICA stenosis, velocity elevation, or turbulent flow.  LEFT VERTEBRAL ARTERY:  Antegrade  IMPRESSION: No significant carotid atherosclerosis or ICA stenosis by ultrasound   Electronically Signed   By: Ruel Favors M.D.   On: 01/21/2013 10:39    Lab Results: Basic Metabolic Panel: No results found for this basename: NA, K, CL, CO2, GLUCOSE, BUN, CREATININE, CALCIUM, MG, PHOS,  in the last 72 hours Liver Function Tests: No results found for this basename: AST, ALT, ALKPHOS, BILITOT, PROT, ALBUMIN,  in the last 72 hours   CBC: No results found for this basename: WBC, NEUTROABS, HGB, HCT, MCV, PLT,  in the last 72 hours  Recent Results (from the past 240 hour(s))  MRSA PCR SCREENING     Status: None   Collection Time    01/21/13  1:30 AM      Result Value Range Status   MRSA by PCR NEGATIVE  NEGATIVE Final   Comment:            The GeneXpert MRSA Assay (FDA     approved for NASAL specimens     only), is one component of a     comprehensive MRSA colonization  surveillance program. It is not     intended to diagnose MRSA     infection nor to guide or     monitor treatment for     MRSA infections.     Hospital Course:  This is a 42 years old female patient with history of hypertension was admitted due to numbness of the rt side of the body. Patient was seen and several tests including MRI/MRA was done. All the tests were negative. Patient improved and she is being discharged to be followed in out patient.  Discharge Exam: Blood pressure 109/65, pulse 69, temperature 98.2 F (36.8 C), temperature source Oral, resp. rate 16, height 5\' 4"  (1.626 m), weight 73.62 kg (162 lb 4.8 oz), SpO2 100.00%.   Disposition:  home        Follow-up  Information   Follow up with [facility], MD In 1 week.   Specialty:  Internal Medicine   Contact information:   [address] [place] [address] [phone]       Signed: Avon Gully 01/24/2013, 8:05 AM

## 2013-01-24 NOTE — Progress Notes (Signed)
01/24/13 1055 Reviewed discharge instructions with patient. Given copy of instructions, medication list, f/u appointment scheduled. Reviewed stroke education sheets with patient, discussed risk factors for stroke, signs and symptoms, when to seek medical attention via teachback method. Pt verbalized understanding, able to state signs and symptoms, when to call MD. Notified Dr. Felecia Shelling of aspirin suppository prescribed and requested oral tablet. Stated would notify pharmacy of change. Prescriptions called in to patient's pharmacy per MD. IV site d/c'd per nurse tech this am, site within normal limits. No c/o pain, discomfort, dizziness, numbness at time of discharge. Pt left floor in stable condition via w/c accompanied by nurse tech. Earnstine Regal, RN

## 2013-01-24 NOTE — Progress Notes (Signed)
UR chart review completed.  

## 2013-01-24 NOTE — Procedures (Signed)
Robin Fuentes, COUTS NO.:  [id number]  MEDICAL RECORD NO.:  [id number]  LOCATION:  EE                           FACILITY:  [facility]  PHYSICIAN:  Zanai Mallari A. Gerilyn Pilgrim, M.D. DATE OF BIRTH:  10-Jan-1971  DATE OF PROCEDURE:  01/23/2013 DATE OF DISCHARGE:  01/23/2013                             EEG INTERPRETATION   INDICATIONS:  This is a 42 year old who presents with episodic paresthesias involving the right facial and right upper extremity.  MEDICATIONS:  Aspirin, atenolol, Lovenox, Senokot, albuterol.  ANALYSIS:  This is a 16-channel recording using standard 10/20 measurement, this is conducted for 22 minutes.  There is a well-formed posterior dominant rhythm of 10.5 to 11 Hz, which attenuates with eye opening.  There is beta activity observed in frontal areas.  Awake and sleep activities are recorded.  There is a brief amount of K complexes, sleep spindles, and vertex sharp waves are seen throughout the recording.  Photic stimulation and hyperventilation were not carried out.  There is no focal or lateralized slowing.  There is no epileptiform activity observed.  IMPRESSION:  This is a normal recording of awake and sleep states.     Vasiliki Smaldone A. Gerilyn Pilgrim, M.D.     KAD/MEDQ  D:  01/24/2013  T:  01/24/2013  Job:  [id number]

## 2013-06-03 ENCOUNTER — Ambulatory Visit: Payer: Self-pay | Admitting: Internal Medicine

## 2013-06-03 VITALS — BP 100/70 | HR 82 | Temp 99.4°F | Resp 16 | Ht 64.0 in | Wt 134.8 lb

## 2013-06-03 DIAGNOSIS — R3915 Urgency of urination: Secondary | ICD-10-CM

## 2013-06-03 DIAGNOSIS — R8281 Pyuria: Secondary | ICD-10-CM

## 2013-06-03 DIAGNOSIS — N9489 Other specified conditions associated with female genital organs and menstrual cycle: Secondary | ICD-10-CM

## 2013-06-03 DIAGNOSIS — N9089 Other specified noninflammatory disorders of vulva and perineum: Secondary | ICD-10-CM

## 2013-06-03 DIAGNOSIS — N766 Ulceration of vulva: Secondary | ICD-10-CM

## 2013-06-03 DIAGNOSIS — R82998 Other abnormal findings in urine: Secondary | ICD-10-CM

## 2013-06-03 LAB — POCT WET PREP WITH KOH
KOH PREP POC: NEGATIVE
RBC Wet Prep HPF POC: NEGATIVE
Trichomonas, UA: NEGATIVE
Yeast Wet Prep HPF POC: NEGATIVE

## 2013-06-03 LAB — POCT URINALYSIS DIPSTICK
Bilirubin, UA: NEGATIVE
Glucose, UA: NEGATIVE
Nitrite, UA: POSITIVE
PH UA: 5.5
Spec Grav, UA: 1.025
Urobilinogen, UA: 0.2

## 2013-06-03 LAB — POCT UA - MICROSCOPIC ONLY
CRYSTALS, UR, HPF, POC: NEGATIVE
Casts, Ur, LPF, POC: NEGATIVE
Yeast, UA: NEGATIVE

## 2013-06-03 MED ORDER — VALACYCLOVIR HCL 1 G PO TABS: 1000.0000 mg | ORAL_TABLET | Freq: Two times a day (BID) | ORAL | Status: DC

## 2013-06-03 MED ORDER — CIPROFLOXACIN HCL 250 MG PO TABS: 250.0000 mg | ORAL_TABLET | Freq: Two times a day (BID) | ORAL | Status: DC
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
## 2013-06-04 LAB — GC/CHLAMYDIA PROBE AMP
CT Probe RNA: NEGATIVE
GC Probe RNA: NEGATIVE

## 2013-06-04 LAB — HIV ANTIBODY (ROUTINE TESTING W REFLEX): HIV: NONREACTIVE

## 2013-06-04 LAB — RPR

## 2013-06-05 LAB — URINE CULTURE: Colony Count: >=100000

## 2013-06-10 LAB — HERPES CULTURE, RAPID: Organism ID, Bacteria: NEGATIVE

## 2013-07-19 ENCOUNTER — Other Ambulatory Visit: Payer: Self-pay

## 2013-07-19 DIAGNOSIS — Z1231 Encounter for screening mammogram for malignant neoplasm of breast: Secondary | ICD-10-CM

## 2013-08-02 ENCOUNTER — Encounter (INDEPENDENT_AMBULATORY_CARE_PROVIDER_SITE_OTHER): Payer: Self-pay

## 2013-08-02 ENCOUNTER — Ambulatory Visit
Admission: RE | Admit: 2013-08-02 | Discharge: 2013-08-02 | Disposition: A | Discharge status: Home / Self Care | Payer: BC Managed Care – PPO | Source: Ambulatory Visit

## 2013-08-02 DIAGNOSIS — Z1231 Encounter for screening mammogram for malignant neoplasm of breast: Secondary | ICD-10-CM

## 2017-01-10 ENCOUNTER — Encounter (HOSPITAL_COMMUNITY): Payer: Self-pay | Admitting: Emergency Medicine

## 2017-01-10 ENCOUNTER — Emergency Department (HOSPITAL_COMMUNITY): Payer: No Typology Code available for payment source

## 2017-01-10 ENCOUNTER — Emergency Department (HOSPITAL_COMMUNITY)
Admission: EM | Admit: 2017-01-10 | Discharge: 2017-01-10 | Disposition: A | Discharge status: Home / Self Care | Payer: No Typology Code available for payment source | Attending: Emergency Medicine | Admitting: Emergency Medicine

## 2017-01-10 DIAGNOSIS — Z79899 Other long term (current) drug therapy: Secondary | ICD-10-CM | POA: Insufficient documentation

## 2017-01-10 DIAGNOSIS — M7918 Myalgia, other site: Secondary | ICD-10-CM | POA: Insufficient documentation

## 2017-01-10 DIAGNOSIS — I10 Essential (primary) hypertension: Secondary | ICD-10-CM | POA: Diagnosis not present

## 2017-01-10 DIAGNOSIS — R200 Anesthesia of skin: Secondary | ICD-10-CM | POA: Insufficient documentation

## 2017-01-10 DIAGNOSIS — Z7982 Long term (current) use of aspirin: Secondary | ICD-10-CM | POA: Diagnosis not present

## 2017-01-10 MED ORDER — CYCLOBENZAPRINE HCL 10 MG PO TABS: 10.0000 mg | ORAL_TABLET | Freq: Two times a day (BID) | ORAL | 0 refills | Status: DC | PRN

## 2017-01-10 NOTE — ED Notes (Signed)
Patient unable to sign for discharge at this time; pt verbalizes understanding of discharge instructions. Opportunity for questioning and answers were provided.
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
## 2019-02-06 ENCOUNTER — Other Ambulatory Visit: Payer: Self-pay

## 2019-02-06 DIAGNOSIS — Z20822 Contact with and (suspected) exposure to covid-19: Secondary | ICD-10-CM

## 2019-02-07 LAB — NOVEL CORONAVIRUS, NAA: SARS-CoV-2, NAA: NOT DETECTED

## 2019-10-01 ENCOUNTER — Other Ambulatory Visit: Payer: Self-pay

## 2019-10-01 ENCOUNTER — Other Ambulatory Visit (HOSPITAL_COMMUNITY): Payer: Self-pay | Admitting: Internal Medicine

## 2019-10-01 ENCOUNTER — Ambulatory Visit (HOSPITAL_COMMUNITY)
Admission: RE | Admit: 2019-10-01 | Discharge: 2019-10-01 | Disposition: A | Discharge status: Home / Self Care | Payer: 59 | Source: Ambulatory Visit | Attending: Internal Medicine | Admitting: Internal Medicine

## 2019-10-01 DIAGNOSIS — M25511 Pain in right shoulder: Secondary | ICD-10-CM | POA: Insufficient documentation

## 2020-01-24 ENCOUNTER — Encounter: Payer: Self-pay | Admitting: Internal Medicine

## 2020-03-06 ENCOUNTER — Other Ambulatory Visit: Payer: Self-pay

## 2020-03-06 ENCOUNTER — Ambulatory Visit (AMBULATORY_SURGERY_CENTER): Payer: Self-pay

## 2020-03-06 VITALS — Ht 64.0 in | Wt 165.0 lb

## 2020-03-06 DIAGNOSIS — Z1211 Encounter for screening for malignant neoplasm of colon: Secondary | ICD-10-CM

## 2020-03-06 MED ORDER — NA SULFATE-K SULFATE-MG SULF 17.5-3.13-1.6 GM/177ML PO SOLN: 1.0000 | Freq: Once | ORAL | 0 refills | Status: AC

## 2020-03-06 NOTE — Progress Notes (Signed)
No allergies to soy or egg Pt is not on blood thinners or diet pills Denies issues with sedation/intubation Denies atrial flutter/fib Denies constipation   Emmi instructions given to pt  Pt is aware of Covid safety and care partner requirements.  

## 2020-03-25 ENCOUNTER — Other Ambulatory Visit: Payer: Self-pay

## 2020-03-25 ENCOUNTER — Ambulatory Visit (AMBULATORY_SURGERY_CENTER): Payer: 59 | Admitting: Internal Medicine

## 2020-03-25 ENCOUNTER — Encounter: Payer: Self-pay | Admitting: Internal Medicine

## 2020-03-25 ENCOUNTER — Other Ambulatory Visit: Payer: Self-pay | Admitting: Internal Medicine

## 2020-03-25 VITALS — BP 136/70 | HR 95 | Temp 99.1°F | Resp 30 | Ht 64.0 in | Wt 165.0 lb

## 2020-03-25 DIAGNOSIS — Z1211 Encounter for screening for malignant neoplasm of colon: Secondary | ICD-10-CM

## 2020-03-25 DIAGNOSIS — D125 Benign neoplasm of sigmoid colon: Secondary | ICD-10-CM

## 2020-03-25 DIAGNOSIS — K635 Polyp of colon: Secondary | ICD-10-CM

## 2020-03-25 MED ORDER — SODIUM CHLORIDE 0.9 % IV SOLN: 500.0000 mL | Freq: Once | INTRAVENOUS | Status: AC

## 2020-03-25 NOTE — Op Note (Signed)
[facility] Patient Name: Robin Fuentes Procedure Date: 03/25/2020 3:28 PM MRN: [id number] Endoscopist: Beverley Fiedler , MD Age: 49 Referring MD:  Date of Birth: 27-Oct-1970 Gender: Female Account #: [id number] Procedure:                Colonoscopy Indications:              Screening for colorectal malignant neoplasm, This                            is the patient's first colonoscopy Medicines:                Monitored Anesthesia Care Procedure:                Pre-Anesthesia Assessment:                           - Prior to the procedure, a History and Physical                            was performed, and patient medications and                            allergies were reviewed. The patient's tolerance of                            previous anesthesia was also reviewed. The risks                            and benefits of the procedure and the sedation                            options and risks were discussed with the patient.                            All questions were answered, and informed consent                            was obtained. Prior Anticoagulants: The patient has                            taken no previous anticoagulant or antiplatelet                            agents. ASA Grade Assessment: III - A patient with                            severe systemic disease. After reviewing the risks                            and benefits, the patient was deemed in                            satisfactory condition to undergo the procedure.  After obtaining informed consent, the colonoscope                            was passed under direct vision. Throughout the                            procedure, the patient's blood pressure, pulse, and                            oxygen saturations were monitored continuously. The                            Olympus PFC-[id number] (#[id number]) Colonoscope was                            introduced through the anus  and advanced to the                            cecum, identified by appendiceal orifice and                            ileocecal valve. The colonoscopy was performed                            without difficulty. The patient tolerated the                            procedure well. The quality of the bowel                            preparation was good. The ileocecal valve,                            appendiceal orifice, and rectum were photographed. Scope In: 3:38:34 PM Scope Out: 3:54:31 PM Scope Withdrawal Time: 0 hours 11 minutes 45 seconds  Total Procedure Duration: 0 hours 15 minutes 57 seconds  Findings:                 The digital rectal exam was normal.                           A 5 mm polyp was found in the sigmoid colon. The                            polyp was sessile. The polyp was removed with a                            cold snare. Resection and retrieval were complete.                           Multiple small and large-mouthed diverticula were                            found in the sigmoid colon, descending colon and  ascending colon.                           The exam was otherwise without abnormality on                            direct and retroflexion views. Complications:            No immediate complications. Estimated Blood Loss:     Estimated blood loss was minimal. Impression:               - One 5 mm polyp in the sigmoid colon, removed with                            a cold snare. Resected and retrieved.                           - Diverticulosis in the sigmoid colon, in the                            descending colon and in the ascending colon.                           - The examination was otherwise normal on direct                            and retroflexion views. Recommendation:           - Patient has a contact number available for                            emergencies. The signs and symptoms of potential                             delayed complications were discussed with the                            patient. Return to normal activities tomorrow.                            Written discharge instructions were provided to the                            patient.                           - Resume previous diet.                           - Continue present medications.                           - Await pathology results.                           - Repeat colonoscopy is recommended. The  colonoscopy date will be determined after pathology                            results from today's exam become available for                            review. Jerene Bears, MD 03/25/2020 3:56:42 PM This report has been signed electronically.

## 2020-03-25 NOTE — Patient Instructions (Addendum)
Handouts were given to you on polyps and diverticulosis. °You may resume your current medications today. °Await biopsy results.  May take 2-3 weeks to receive your pathology results. °Please call if any questions or concerns. ° ° ° ° ° °YOU HAD AN ENDOSCOPIC PROCEDURE TODAY AT THE [facility]:   Refer to the procedure report that was given to you for any specific questions about what was found during the examination.  If the procedure report does not answer your questions, please call your gastroenterologist to clarify.  If you requested that your care partner not be given the details of your procedure findings, then the procedure report has been included in a sealed envelope for you to review at your convenience later. ° °YOU SHOULD EXPECT: Some feelings of bloating in the abdomen. Passage of more gas than usual.  Walking can help get rid of the air that was put into your GI tract during the procedure and reduce the bloating. If you had a lower endoscopy (such as a colonoscopy or flexible sigmoidoscopy) you may notice spotting of blood in your stool or on the toilet paper. If you underwent a bowel prep for your procedure, you may not have a normal bowel movement for a few days. ° °Please Note:  You might notice some irritation and congestion in your nose or some drainage.  This is from the oxygen used during your procedure.  There is no need for concern and it should clear up in a day or so. ° °SYMPTOMS TO REPORT IMMEDIATELY: ° °· Following lower endoscopy (colonoscopy or flexible sigmoidoscopy): ° Excessive amounts of blood in the stool ° Significant tenderness or worsening of abdominal pains ° Swelling of the abdomen that is new, acute ° Fever of 100°F or higher ° ° ° °For urgent or emergent issues, a gastroenterologist can be reached at any hour by calling [phone]. °Do not use MyChart messaging for urgent concerns.  ° ° °DIET:  We do recommend a small meal at first, but then you may proceed  to your regular diet.  Drink plenty of fluids but you should avoid alcoholic beverages for 24 hours. ° °ACTIVITY:  You should plan to take it easy for the rest of today and you should NOT DRIVE or use heavy machinery until tomorrow (because of the sedation medicines used during the test).   ° °FOLLOW UP: °Our staff will call the number listed on your records 48-72 hours following your procedure to check on you and address any questions or concerns that you may have regarding the information given to you following your procedure. If we do not reach you, we will leave a message.  We will attempt to reach you two times.  During this call, we will ask if you have developed any symptoms of COVID 19. If you develop any symptoms (ie: fever, flu-like symptoms, shortness of breath, cough etc.) before then, please call [phone].  If you test positive for Covid 19 in the 2 weeks post procedure, please call and report this information to us.   ° °If any biopsies were taken you will be contacted by phone or by letter within the next 1-3 weeks.  Please call us at [phone] if you have not heard about the biopsies in 3 weeks.  ° ° °SIGNATURES/CONFIDENTIALITY: °You and/or your care partner have signed paperwork which will be entered into your electronic medical record.  These signatures attest to the fact that that the information above on your After   has been reviewed and is understood.  Full responsibility of the confidentiality of this discharge information lies with you and/or your care-partner.

## 2020-03-25 NOTE — Progress Notes (Signed)
Report given to PACU, vss 

## 2020-03-25 NOTE — Progress Notes (Signed)
Called to room to assist during endoscopic procedure.  Patient ID and intended procedure confirmed with present staff. Received instructions for my participation in the procedure from the performing physician.  

## 2020-03-25 NOTE — Progress Notes (Signed)
No problems noted in the recovery room. maw 

## 2020-03-25 NOTE — Progress Notes (Signed)
Pt's states no medical or surgical changes since previsit or office visit.  ° °VS AG °

## 2020-03-30 ENCOUNTER — Telehealth: Payer: Self-pay

## 2020-03-30 ENCOUNTER — Telehealth: Payer: Self-pay | Admitting: *Deleted

## 2020-03-30 NOTE — Telephone Encounter (Signed)
Called [phone] and left a message we tried to reach pt for a follow up call. maw

## 2020-03-30 NOTE — Telephone Encounter (Signed)
  Follow up Call-  Call back number 03/25/2020  Post procedure Call Back phone  # [phone]  Permission to leave phone message Yes  Some recent data might be hidden     Patient questions:  Do you have a fever, pain , or abdominal swelling? No. Pain Score  0 *  Have you tolerated food without any problems? Yes.    Have you been able to return to your normal activities? Yes.    Do you have any questions about your discharge instructions: Diet   No. Medications  No. Follow up visit  No.  Do you have questions or concerns about your Care? No.  Actions: * If pain score is 4 or above: No action needed, pain <4.  1. Have you developed a fever since your procedure? NO  2.   Have you had an respiratory symptoms (SOB or cough) since your procedure? NO  3.   Have you tested positive for COVID 19 since your procedure NO  4.   Have you had any family members/close contacts diagnosed with the COVID 19 since your procedure?  NO   If yes to any of these questions please route to Laverna Peace, RN and Karlton Lemon, RN

## 2020-04-02 ENCOUNTER — Encounter: Payer: Self-pay | Admitting: Internal Medicine

## 2020-08-12 ENCOUNTER — Other Ambulatory Visit (HOSPITAL_COMMUNITY)
Admission: RE | Admit: 2020-08-12 | Discharge: 2020-08-12 | Disposition: A | Discharge status: Home / Self Care | Payer: 59 | Source: Ambulatory Visit | Attending: Adult Health | Admitting: Adult Health

## 2020-08-12 ENCOUNTER — Ambulatory Visit (INDEPENDENT_AMBULATORY_CARE_PROVIDER_SITE_OTHER): Payer: 59 | Admitting: Adult Health

## 2020-08-12 ENCOUNTER — Other Ambulatory Visit: Payer: Self-pay

## 2020-08-12 ENCOUNTER — Encounter: Payer: Self-pay | Admitting: Adult Health

## 2020-08-12 VITALS — BP 123/79 | HR 83 | Ht 63.5 in | Wt 156.0 lb

## 2020-08-12 DIAGNOSIS — Z90710 Acquired absence of both cervix and uterus: Secondary | ICD-10-CM | POA: Diagnosis not present

## 2020-08-12 DIAGNOSIS — Z01419 Encounter for gynecological examination (general) (routine) without abnormal findings: Secondary | ICD-10-CM | POA: Diagnosis not present

## 2020-08-12 DIAGNOSIS — Z113 Encounter for screening for infections with a predominantly sexual mode of transmission: Secondary | ICD-10-CM | POA: Diagnosis present

## 2020-08-12 DIAGNOSIS — Z1211 Encounter for screening for malignant neoplasm of colon: Secondary | ICD-10-CM | POA: Insufficient documentation

## 2020-08-12 DIAGNOSIS — N9412 Deep dyspareunia: Secondary | ICD-10-CM

## 2020-08-12 LAB — HEMOCCULT GUIAC POC 1CARD (OFFICE): Fecal Occult Blood, POC: NEGATIVE

## 2020-08-12 NOTE — Progress Notes (Signed)
Patient ID: Robin Fuentes, female   DOB: 12/11/1970, 50 y.o.   MRN: [id number] History of Present Illness: Robin Fuentes is a 50 year old black female,single, sp hysterectomy in for a well woman gyn exam. She has pain in ovary area with sex. PCP is Dr Legrand Rams.    Current Medications, Allergies, Past Medical History, Past Surgical History, Family History and Social History were reviewed in [employer] record.     Review of Systems:  Patient denies any headaches, hearing loss, fatigue, blurred vision, shortness of breath, chest pain, abdominal pain, problems with bowel movements, urination. No joint pain or mood swings. Has pain with sex in ovary area +hot flashes, on and off since about age 50    Physical Exam:BP 123/79 (BP Location: Left Arm, Patient Position: Sitting, Cuff Size: Normal)   Pulse 83   Ht 5' 3.5" (1.613 m)   Wt 156 lb (70.8 kg)   BMI 27.20 kg/m  General:  Well developed, well nourished, no acute distress Skin:  Warm and dry Neck:  Midline trachea, normal thyroid, good ROM, no lymphadenopathy Lungs; Clear to auscultation bilaterally Breast:  No dominant palpable mass, retraction, or nipple discharge Cardiovascular: Regular rate and rhythm Abdomen:  Soft, non tender, no hepatosplenomegaly Pelvic:  External genitalia is normal in appearance, no lesions.  The vagina is normal in appearance,vaginal cuff looks good, discharge creamy white. Urethra has no lesions or masses. The cervix and uterus are absent.  No adnexal masses,+ tenderness noted over ovaries.Bladder is non tender, no masses felt.CV swab obtained. Rectal: Good sphincter tone, no polyps, or hemorrhoids felt.  Hemoccult negative. Extremities/musculoskeletal:  No swelling or varicosities noted, no clubbing or cyanosis Psych:  No mood changes, alert and cooperative,seems happy  AA is 1 Fall risk is low Depression screen PHQ 2/9 08/12/2020  Decreased Interest 0  Down, Depressed, Hopeless 2  PHQ - 2  Score 2  Altered sleeping 0  Tired, decreased energy 2  Change in appetite 0  Feeling bad or failure about yourself  0  Trouble concentrating 0  Moving slowly or fidgety/restless 0  Suicidal thoughts 0  PHQ-9 Score 4   GAD 7 : Generalized Anxiety Score 08/12/2020  Nervous, Anxious, on Edge 2  Control/stop worrying 0  Worry too much - different things 2  Trouble relaxing 0  Restless 0  Easily annoyed or irritable 2  Afraid - awful might happen 0  Total GAD 7 Score 6    Upstream - 08/12/20 1428      Pregnancy Intention Screening   Does the patient want to become pregnant in the next year? N/A    Does the patient's partner want to become pregnant in the next year? N/A    Would the patient like to discuss contraceptive options today? N/A      Contraception Wrap Up   Current Method Female Sterilization   hyst   End Method Female Sterilization   hyst   Contraception Counseling Provided No         Examination chaperoned by Levy Pupa LPN.  Impression and Plan: 1. Encounter for well woman exam with routine gynecological exam Pelvic in 1 year Mammogram yearly Colonoscopy per GI, had one 02/2020 in [place]  2. Encounter for screening fecal occult blood testing  3. S/P hysterectomy No pap needed   4. Deep dyspareunia Try different position for less penetration Will get pelvic US to assess ovaries in about 4 weeks    5. Screening examination for STD (  sexually transmitted disease) CV swab sent for GC/CHL,trich,BV and yeast

## 2020-08-14 ENCOUNTER — Other Ambulatory Visit: Payer: Self-pay | Admitting: Adult Health

## 2020-08-14 LAB — CERVICOVAGINAL ANCILLARY ONLY
Bacterial Vaginitis (gardnerella): POSITIVE — AB
Candida Glabrata: NEGATIVE
Candida Vaginitis: NEGATIVE
Chlamydia: NEGATIVE
Comment: NEGATIVE
Comment: NEGATIVE
Comment: NEGATIVE
Comment: NEGATIVE
Comment: NEGATIVE
Comment: NORMAL
Neisseria Gonorrhea: NEGATIVE
Trichomonas: NEGATIVE

## 2020-08-14 MED ORDER — METRONIDAZOLE 500 MG PO TABS: 500.0000 mg | ORAL_TABLET | Freq: Two times a day (BID) | ORAL | 0 refills | Status: DC

## 2020-08-14 NOTE — Progress Notes (Signed)
+  BV on vaginal swab will rx flagyl 

## 2020-09-09 ENCOUNTER — Ambulatory Visit (INDEPENDENT_AMBULATORY_CARE_PROVIDER_SITE_OTHER): Payer: 59

## 2020-09-09 ENCOUNTER — Other Ambulatory Visit: Payer: Self-pay

## 2020-09-09 DIAGNOSIS — N9412 Deep dyspareunia: Secondary | ICD-10-CM

## 2020-09-09 NOTE — Progress Notes (Signed)
PELVIC US TA/TV:normal vaginal cuff,normal left ovary,normal right ovary,simple cyst adjacent to right ovary 1.6 x 1.1 x 1.5 cm,no free fluid,ovaries appear mobile,no pain during ultrasound  Chaperone Peggy

## 2020-09-15 ENCOUNTER — Telehealth: Payer: Self-pay | Admitting: Adult Health

## 2020-09-15 NOTE — Telephone Encounter (Signed)
Pt informed that US showed normal vaginal cuff, and normal ovaries, has small simple cyst next to right ovary, I would not think that is source of apin with sex, try different positions

## 2020-09-20 ENCOUNTER — Telehealth: Payer: 59 | Admitting: Physician Assistant

## 2020-09-20 NOTE — Progress Notes (Unsigned)
Couldn't start note: following message appeared: " you are not licensed in the pt's current state"

## 2020-12-10 ENCOUNTER — Ambulatory Visit (HOSPITAL_COMMUNITY)
Admission: RE | Admit: 2020-12-10 | Discharge: 2020-12-10 | Disposition: A | Discharge status: Home / Self Care | Payer: 59 | Source: Ambulatory Visit | Attending: Gerontology | Admitting: Gerontology

## 2020-12-10 ENCOUNTER — Other Ambulatory Visit (HOSPITAL_COMMUNITY): Payer: Self-pay | Admitting: Gerontology

## 2020-12-10 ENCOUNTER — Other Ambulatory Visit: Payer: Self-pay

## 2020-12-10 DIAGNOSIS — M5451 Vertebrogenic low back pain: Secondary | ICD-10-CM | POA: Insufficient documentation

## 2020-12-10 DIAGNOSIS — M542 Cervicalgia: Secondary | ICD-10-CM

## 2021-08-16 ENCOUNTER — Encounter: Payer: Self-pay | Admitting: Adult Health

## 2021-08-16 ENCOUNTER — Ambulatory Visit (INDEPENDENT_AMBULATORY_CARE_PROVIDER_SITE_OTHER): Payer: 59 | Admitting: Adult Health

## 2021-08-16 VITALS — BP 127/84 | HR 82 | Ht 64.25 in | Wt 172.5 lb

## 2021-08-16 DIAGNOSIS — Z90710 Acquired absence of both cervix and uterus: Secondary | ICD-10-CM | POA: Diagnosis not present

## 2021-08-16 DIAGNOSIS — Z1211 Encounter for screening for malignant neoplasm of colon: Secondary | ICD-10-CM

## 2021-08-16 DIAGNOSIS — Z01419 Encounter for gynecological examination (general) (routine) without abnormal findings: Secondary | ICD-10-CM

## 2021-08-16 LAB — HEMOCCULT GUIAC POC 1CARD (OFFICE): Fecal Occult Blood, POC: NEGATIVE

## 2021-08-16 NOTE — Progress Notes (Signed)
Patient ID: Robin Fuentes, female   DOB: 26-Jul-1970, 51 y.o.   MRN: [id number] History of Present Illness: Robin Fuentes is a 51 year old black female, with SO, sp hysterectomy in for a well woman gyn exam. PCP is Dr Legrand Rams    Current Medications, Allergies, Past Medical History, Past Surgical History, Family History and Social History were reviewed in [facility] record.     Review of Systems: Patient denies any headaches, hearing loss, fatigue, blurred vision, shortness of breath, chest pain, abdominal pain, problems with bowel movements, (she did take ex lax yesterday(,urination, or intercourse. No joint pain or mood swings.     Physical Exam:BP 127/84 (BP Location: Left Arm, Patient Position: Sitting, Cuff Size: Normal)   Pulse 82   Ht 5' 4.25" (1.632 m)   Wt 172 lb 8 oz (78.2 kg)   BMI 29.38 kg/m   General:  Well developed, well nourished, no acute distress Skin:  Warm and dry Neck:  Midline trachea, normal thyroid, good ROM, no lymphadenopathy Lungs; Clear to auscultation bilaterally Breast:  No dominant palpable mass, retraction, or nipple discharge Cardiovascular: Regular rate and rhythm Abdomen:  Soft, non tender, no hepatosplenomegaly Pelvic:  External genitalia is normal in appearance, no lesions.  The vagina is normal in appearance,pink and moist. Urethra has no lesions or masses. The cervix and uterus are absent.  No adnexal masses or tenderness noted.Bladder is non tender, no masses felt. Rectal: Good sphincter tone, no polyps, or hemorrhoids felt.  Hemoccult negative. Extremities/musculoskeletal:  No swelling or varicosities noted, no clubbing or cyanosis Psych:  No mood changes, alert and cooperative,seems happy AA is 1 Fall risk is low    08/16/2021    1:46 PM 08/12/2020    2:18 PM  Depression screen PHQ 2/9  Decreased Interest 0 0  Down, Depressed, Hopeless 0 2  PHQ - 2 Score 0 2  Altered sleeping 0 0  Tired, decreased energy 0 2  Change in  appetite 0 0  Feeling bad or failure about yourself  0 0  Trouble concentrating 0 0  Moving slowly or fidgety/restless 0 0  Suicidal thoughts 0 0  PHQ-9 Score 0 4       08/16/2021    1:46 PM 08/12/2020    2:18 PM  GAD 7 : Generalized Anxiety Score  Nervous, Anxious, on Edge 0 2  Control/stop worrying 0 0  Worry too much - different things 0 2  Trouble relaxing 0 0  Restless 0 0  Easily annoyed or irritable 0 2  Afraid - awful might happen 0 0  Total GAD 7 Score 0 6      [facility] - 08/16/21 1345       Pregnancy Intention Screening   Does the patient want to become pregnant in the next year? N/A    Does the patient's partner want to become pregnant in the next year? N/A    Would the patient like to discuss contraceptive options today? N/A      Contraception Wrap Up   Current Method Female Sterilization   hyst   End Method Female Sterilization   hyst   Contraception Counseling Provided No            Examination chaperoned by Levy Pupa LPN   Impression and Plan: 1. Encounter for well woman exam with routine gynecological exam Physical in 1 year Labs with PCP Mammogram yearly Colonoscopy per GI   2. Encounter for screening fecal occult blood testing Hemoccult  was negative   3. S/P hysterectomy

## 2021-08-19 ENCOUNTER — Telehealth: Payer: 59 | Admitting: Physician Assistant

## 2021-08-19 DIAGNOSIS — B3731 Acute candidiasis of vulva and vagina: Secondary | ICD-10-CM | POA: Diagnosis not present

## 2021-08-19 MED ORDER — FLUCONAZOLE 150 MG PO TABS: 150.0000 mg | ORAL_TABLET | Freq: Once | ORAL | 0 refills | Status: AC

## 2021-08-19 NOTE — Progress Notes (Signed)
I have spent 5 minutes in review of e-visit questionnaire, review and updating patient chart, medical decision making and response to patient.   Roshanda Balazs Cody Infant Doane, PA-C    

## 2021-08-19 NOTE — Progress Notes (Signed)

## 2021-09-14 ENCOUNTER — Other Ambulatory Visit (HOSPITAL_COMMUNITY): Payer: Self-pay | Admitting: Gerontology

## 2021-09-14 ENCOUNTER — Ambulatory Visit (HOSPITAL_COMMUNITY)
Admission: RE | Admit: 2021-09-14 | Discharge: 2021-09-14 | Disposition: A | Discharge status: Home / Self Care | Payer: 59 | Source: Ambulatory Visit | Attending: Gerontology | Admitting: Gerontology

## 2021-09-14 DIAGNOSIS — M542 Cervicalgia: Secondary | ICD-10-CM

## 2021-09-14 DIAGNOSIS — M25519 Pain in unspecified shoulder: Secondary | ICD-10-CM | POA: Insufficient documentation

## 2021-12-09 DIAGNOSIS — M7542 Impingement syndrome of left shoulder: Secondary | ICD-10-CM | POA: Diagnosis not present

## 2021-12-09 DIAGNOSIS — Z6829 Body mass index (BMI) 29.0-29.9, adult: Secondary | ICD-10-CM | POA: Diagnosis not present

## 2021-12-14 DIAGNOSIS — M542 Cervicalgia: Secondary | ICD-10-CM | POA: Diagnosis not present

## 2021-12-14 DIAGNOSIS — M25512 Pain in left shoulder: Secondary | ICD-10-CM | POA: Diagnosis not present

## 2021-12-16 DIAGNOSIS — Z1329 Encounter for screening for other suspected endocrine disorder: Secondary | ICD-10-CM | POA: Diagnosis not present

## 2021-12-16 DIAGNOSIS — J329 Chronic sinusitis, unspecified: Secondary | ICD-10-CM | POA: Diagnosis not present

## 2021-12-16 DIAGNOSIS — I10 Essential (primary) hypertension: Secondary | ICD-10-CM | POA: Diagnosis not present

## 2021-12-16 DIAGNOSIS — K219 Gastro-esophageal reflux disease without esophagitis: Secondary | ICD-10-CM | POA: Diagnosis not present

## 2021-12-16 DIAGNOSIS — M5033 Other cervical disc degeneration, cervicothoracic region: Secondary | ICD-10-CM | POA: Diagnosis not present

## 2021-12-16 DIAGNOSIS — Z0001 Encounter for general adult medical examination with abnormal findings: Secondary | ICD-10-CM | POA: Diagnosis not present

## 2021-12-16 DIAGNOSIS — R799 Abnormal finding of blood chemistry, unspecified: Secondary | ICD-10-CM | POA: Diagnosis not present

## 2021-12-16 DIAGNOSIS — E782 Mixed hyperlipidemia: Secondary | ICD-10-CM | POA: Diagnosis not present

## 2021-12-22 ENCOUNTER — Telehealth: Payer: Self-pay | Admitting: Physician Assistant

## 2021-12-22 NOTE — Telephone Encounter (Signed)
Scheduled appt per 9/27 referral. Pt is aware of appt date and time. Pt is aware to arrive 15 mins prior to appt time and to bring and updated insurance card. Pt is aware of appt location.   

## 2021-12-23 DIAGNOSIS — M542 Cervicalgia: Secondary | ICD-10-CM | POA: Diagnosis not present

## 2021-12-23 DIAGNOSIS — S46012D Strain of muscle(s) and tendon(s) of the rotator cuff of left shoulder, subsequent encounter: Secondary | ICD-10-CM | POA: Diagnosis not present

## 2022-01-03 DIAGNOSIS — M542 Cervicalgia: Secondary | ICD-10-CM | POA: Diagnosis not present

## 2022-01-03 DIAGNOSIS — S46012D Strain of muscle(s) and tendon(s) of the rotator cuff of left shoulder, subsequent encounter: Secondary | ICD-10-CM | POA: Diagnosis not present

## 2022-01-06 DIAGNOSIS — Z1231 Encounter for screening mammogram for malignant neoplasm of breast: Secondary | ICD-10-CM | POA: Diagnosis not present

## 2022-01-10 DIAGNOSIS — M542 Cervicalgia: Secondary | ICD-10-CM | POA: Diagnosis not present

## 2022-01-10 DIAGNOSIS — S46012D Strain of muscle(s) and tendon(s) of the rotator cuff of left shoulder, subsequent encounter: Secondary | ICD-10-CM | POA: Diagnosis not present

## 2022-01-11 DIAGNOSIS — M542 Cervicalgia: Secondary | ICD-10-CM | POA: Diagnosis not present

## 2022-01-11 NOTE — Progress Notes (Signed)
[facility] [phone]   Fax:([phone]) [place] [facility] NOTE  Patient Care Team: Carrolyn Meiers, MD as PCP - General (Internal Medicine)  Hematological/Oncological History 1) Labs from PCP, Dr. Rosita Fire: -09/18/2018: WBC 6.7, RBC 5.53 (H), Hgb 13.2, MCV 75.2 (L), Plt 267, ANC 4181 -10/01/2019: WBC 6.8, RBC 5.67 (H), Hgb 13.1, MCV 74.1 (L), Plt 253, ANC 4053 -12/10/2020: WBC 6.3, RBC 5.51 (H), Hgb 13.2, MCV 75.7 (L), Plt 253, ANC 3427 -12/16/2021: WBC 11.2 (H), RBC 5.57 (H), Hgb 12.9, MCV 74.7 (L), ANC 9016 (H), Plt 293.   2) 01/12/2022: Establish care with [facility] Hematology with Dr. Narda Rutherford and Dede Query PA-C  CHIEF COMPLAINTS/PURPOSE OF CONSULTATION:  Leukocytosis/Neutrophilia.   HISTORY OF PRESENTING ILLNESS:  Robin Fuentes 51 y.o. female with medical history significant for hyperlipidemia, hypertension, stroke and acid reflux to the hematology clinic for evaluation for leukocytosis/neutrophilia.  She is unaccompanied for this visit.  On exam today, Robin Fuentes reports having occasional episodes of fatigue but completes all her daily activities on her own.  She has a good appetite and denies any weight loss.  She experiences occasional nausea without any vomiting episodes.  Her bowel habits are unchanged without any recurrent episodes of diarrhea or constipation.  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  She denies fevers, chills, night sweats, shortness of breath, chest pain or cough.  She has no other complaints.  Rest of the 10 point ROS is below.  MEDICAL HISTORY:  Past Medical History:  Diagnosis Date   Acid reflux    Anemia    seasonal   Heart murmur    from childhood.   Hyperlipidemia    Hypertension    Stroke [facility])    about 13 yrs ago had numbness on rt side but passed without further issues.     SURGICAL HISTORY: Past Surgical History:  Procedure  Laterality Date   ABDOMINAL HYSTERECTOMY      SOCIAL HISTORY: Social History   Socioeconomic History   Marital status: Significant Other    Spouse name: Not on file   Number of children: Not on file   Years of education: Not on file   Highest education level: Not on file  Occupational History   Not on file  Tobacco Use   Smoking status: Never   Smokeless tobacco: Never  Vaping Use   Vaping Use: Never used  Substance and Sexual Activity   Alcohol use: Yes    Comment: occasionally   Drug use: No   Sexual activity: Yes    Birth control/protection: Surgical    Comment: hyst  Other Topics Concern   Not on file  Social History Narrative   Not on file   Social Determinants of Health   Financial Resource Strain: Low Risk  (08/16/2021)   Overall Financial Resource Strain (CARDIA)    Difficulty of Paying Living Expenses: Not hard at all  Food Insecurity: No Food Insecurity (08/16/2021)   Hunger Vital Sign    Worried About Running Out of Food in the Last Year: Never true    Dale in the Last Year: Never true  Transportation Needs: No Transportation Needs (08/16/2021)   PRAPARE - Hydrologist (Medical): No    Lack of Transportation (Non-Medical): No  Physical Activity: Insufficiently Active (08/16/2021)   Exercise Vital Sign    Days of Exercise per Week:  2 days    Minutes of Exercise per Session: 30 min  Stress: No Stress Concern Present (08/16/2021)   [place]    Feeling of Stress : Not at all  Social Connections: Moderately Isolated (08/16/2021)   Social Connection and Isolation Panel [NHANES]    Frequency of Communication with Friends and Family: Three times a week    Frequency of Social Gatherings with Friends and Family: Three times a week    Attends Religious Services: 1 to 4 times per year    Active Member of Clubs or Organizations: No    Attends Theatre manager Meetings: Never    Marital Status: Divorced  Human resources officer Violence: Not At Risk (08/16/2021)   Humiliation, Afraid, Rape, and Kick questionnaire    Fear of Current or Ex-Partner: No    Emotionally Abused: No    Physically Abused: No    Sexually Abused: No    FAMILY HISTORY: Family History  Problem Relation Age of Onset   Hyperlipidemia Mother    Hypertension Mother    Hypertension Father    Hyperlipidemia Father    COPD Father    Stomach cancer Maternal Grandmother    Stomach cancer Maternal Grandfather    Hypertension Son    Colon cancer Neg Hx    Colon polyps Neg Hx    Esophageal cancer Neg Hx    Rectal cancer Neg Hx     ALLERGIES:  is allergic to codeine, phenergan [promethazine hcl], and sulfa antibiotics.  MEDICATIONS:  Current Outpatient Medications  Medication Sig Dispense Refill   albuterol (VENTOLIN HFA) 108 (90 Base) MCG/ACT inhaler Inhale 2 puffs into the lungs every 6 (six) hours as needed.     amLODipine (NORVASC) 10 MG tablet Take 10 mg by mouth at bedtime.     ASPIRIN 81 PO Take by mouth.     diphenhydrAMINE HCl (BENADRYL PO) Take by mouth.     gabapentin (NEURONTIN) 100 MG capsule Take 100 mg by mouth daily.     hydrochlorothiazide (HYDRODIURIL) 12.5 MG tablet Take 12.5 mg by mouth every morning.     methocarbamol (ROBAXIN) 500 MG tablet Take 500 mg by mouth 3 (three) times daily.     metoprolol succinate (TOPROL-XL) 50 MG 24 hr tablet Take 50 mg by mouth daily. Take with or immediately following a meal.     Naproxen Sodium (ALEVE PO) Take by mouth.     omeprazole (PRILOSEC) 40 MG capsule Take 40 mg by mouth daily.     simvastatin (ZOCOR) 20 MG tablet Take 20 mg by mouth at bedtime.     Current Facility-Administered Medications  Medication Dose Route Frequency Provider Last Rate Last Admin   0.9 %  sodium chloride infusion  500 mL Intravenous Once Pyrtle, Lajuan Lines, MD        REVIEW OF SYSTEMS:   Constitutional: ( - ) fevers, ( - )  chills  , ( - ) night sweats Eyes: ( - ) blurriness of vision, ( - ) double vision, ( - ) watery eyes Ears, nose, mouth, throat, and face: ( - ) mucositis, ( - ) sore throat Respiratory: ( - ) cough, ( - ) dyspnea, ( - ) wheezes Cardiovascular: ( - ) palpitation, ( - ) chest discomfort, ( - ) lower extremity swelling Gastrointestinal:  ( - ) nausea, ( - ) heartburn, ( - ) change in bowel habits Skin: ( - ) abnormal skin rashes Lymphatics: ( - )  new lymphadenopathy, ( - ) easy bruising Neurological: ( - ) numbness, ( - ) tingling, ( - ) new weaknesses Behavioral/Psych: ( - ) mood change, ( - ) new changes  All other systems were reviewed with the patient and are negative.  PHYSICAL EXAMINATION: ECOG PERFORMANCE STATUS: 1 - Symptomatic but completely ambulatory  Vitals:   01/12/22 0915  BP: 136/86  Pulse: 84  Resp: 18  Temp: 98.1 F (36.7 C)  SpO2: 100%   Filed Weights   01/12/22 0915  Weight: 173 lb 8 oz (78.7 kg)    GENERAL: well appearing female in NAD  SKIN: skin color, texture, turgor are normal, no rashes or significant lesions EYES: conjunctiva are pink and non-injected, sclera clear OROPHARYNX: no exudate, no erythema; lips, buccal mucosa, and tongue normal  NECK: supple, non-tender LYMPH:  no palpable lymphadenopathy in the cervical or supraclavicular lymph nodes.  LUNGS: clear to auscultation and percussion with normal breathing effort HEART: regular rate & rhythm and no murmurs and no lower extremity edema ABDOMEN: soft, non-tender, non-distended, normal bowel sounds Musculoskeletal: no cyanosis of digits and no clubbing  PSYCH: alert & oriented x 3, fluent speech NEURO: no focal motor/sensory deficits  LABORATORY DATA:  I have reviewed the data as listed    Latest Ref Rng & Units 01/21/2013    2:00 AM 01/25/2008   12:16 PM 01/11/2008    5:30 AM  CBC  WBC 4.0 - 10.5 K/uL 7.6  6.2  13.5   Hemoglobin 12.0 - 15.0 g/dL 14.5  13.4  10.4   Hematocrit 36.0 - 46.0 % 44.2   41.8  31.8   Platelets 150 - 400 K/uL 168  274  175        Latest Ref Rng & Units 01/21/2013    2:00 AM 01/25/2008   12:16 PM 01/07/2008   11:55 AM  CMP  Glucose 70 - 99 mg/dL 100  97  72   BUN 6 - 23 mg/dL '12  9  7   '$ Creatinine 0.50 - 1.10 mg/dL 0.87  0.78  0.86   Sodium 135 - 145 mEq/L 134  136  139   Potassium 3.5 - 5.1 mEq/L 3.6  3.8  3.8   Chloride 96 - 112 mEq/L 98  105  105   CO2 19 - 32 mEq/L '26  26  29   '$ Calcium 8.4 - 10.5 mg/dL 9.3  9.5  8.9   Total Protein 6.0 - 8.3 g/dL 8.1   6.9   Total Bilirubin 0.3 - 1.2 mg/dL 0.3   0.4   Alkaline Phos 39 - 117 U/L 94   72   AST 0 - 37 U/L 24   17   ALT 0 - 35 U/L 24   14    ASSESSMENT & PLAN Robin Fuentes is a 51 y.o. female presents to the hematology clinic for evaluation for leukocytosis/neutrophilia.  We reviewed possible etiologies including infectious process, inflammatory process, medication induced, smoking, obstructive sleep apnea and underlying hematologic condition.  Reviewed her medication list and she does not take anything that could increase white blood cell count.  Patient does not smoke and denies any signs/symptoms of obstructive sleep apnea.  We discussed baseline work-up which includes repeating blood counts and checking inflammatory markers.  If there is persistent leukocytosis, we will proceed with additional diagnostic testing including BCR/ABL FISH.  Patient expressed understanding of the plan provided and would like to continue with the recommended work-up.  #Leukocytosis/Neutrophilia: --Labs today  to check CBC, CMP, Sed rate, C-reactive protein levels.  --If there is persistent leukocytosis and workup today is negative, we will check MPN panel and BCR/ABL FISH --RTC based on above workup  #Microcytosis: -- Upon lab review, this is chronic in nature.  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 -- Consider additional testing with hemoglobin electrophoresis if there is  evidence of anemia.  Orders Placed This Encounter  Procedures   Ferritin    Standing Status:   Future    Standing Expiration Date:   01/13/2023   Iron and Iron Binding Capacity ([facility] only)    Standing Status:   Future    Standing Expiration Date:   01/13/2023   CBC with Differential ([facility] Only)    Standing Status:   Future    Standing Expiration Date:   01/13/2023   CMP ([facility] only)    Standing Status:   Future    Standing Expiration Date:   01/13/2023   Sedimentation rate    Standing Status:   Future    Standing Expiration Date:   01/12/2023   C-reactive protein    Standing Status:   Future    Standing Expiration Date:   01/12/2023    All questions were answered. The patient knows to call the clinic with any problems, questions or concerns.  I have spent a total of 60 minutes minutes of face-to-face and non-face-to-face time, preparing to see the patient, obtaining and/or reviewing separately obtained history, performing a medically appropriate examination, counseling and educating the patient, ordering tests/procedures,documenting clinical information in the electronic health record, and care coordination.   Dede Query, PA-C Department of Hematology/Oncology [place] at [facility] Phone: [phone]  Patient was seen with Dr. Lorenso Courier  I have read the above note and personally examined the patient. I agree with the assessment and plan as noted above.  Briefly Robin Fuentes is a 51 year old female who presents for evaluation of leukocytosis with neutrophilic predominance.  Etiology of this is unclear at this time.  We will order inflammatory markers as well as repeat CBC to reassess.  Additionally patient has microcytosis.  We will further assess this with iron deficiency labs, however if patient has normal stores of iron can consider further work-up for hemoglobinopathy.  The patient voiced understanding of our plan moving  forward.   Ledell Peoples, MD Department of Hematology/Oncology [place] at [facility] Phone: [phone] Pager: [phone] Email: [email or web address]

## 2022-01-12 ENCOUNTER — Other Ambulatory Visit: Payer: Self-pay

## 2022-01-12 ENCOUNTER — Inpatient Hospital Stay: Payer: 59

## 2022-01-12 ENCOUNTER — Encounter: Payer: Self-pay | Admitting: Physician Assistant

## 2022-01-12 ENCOUNTER — Inpatient Hospital Stay: Payer: 59 | Attending: Physician Assistant | Admitting: Physician Assistant

## 2022-01-12 VITALS — BP 136/86 | HR 84 | Temp 98.1°F | Resp 18 | Ht 64.25 in | Wt 173.5 lb

## 2022-01-12 DIAGNOSIS — R718 Other abnormality of red blood cells: Secondary | ICD-10-CM

## 2022-01-12 DIAGNOSIS — Z79899 Other long term (current) drug therapy: Secondary | ICD-10-CM | POA: Insufficient documentation

## 2022-01-12 DIAGNOSIS — D72829 Elevated white blood cell count, unspecified: Secondary | ICD-10-CM

## 2022-01-12 DIAGNOSIS — E785 Hyperlipidemia, unspecified: Secondary | ICD-10-CM | POA: Diagnosis not present

## 2022-01-12 DIAGNOSIS — I10 Essential (primary) hypertension: Secondary | ICD-10-CM | POA: Insufficient documentation

## 2022-01-12 LAB — CMP (CANCER CENTER ONLY)
ALT: 17 U/L (ref 0–44)
AST: 16 U/L (ref 15–41)
Albumin: 4.3 g/dL (ref 3.5–5.0)
Alkaline Phosphatase: 79 U/L (ref 38–126)
Anion gap: 7 (ref 5–15)
BUN: 9 mg/dL (ref 6–20)
CO2: 26 mmol/L (ref 22–32)
Calcium: 9.3 mg/dL (ref 8.9–10.3)
Chloride: 108 mmol/L (ref 98–111)
Creatinine: 0.91 mg/dL (ref 0.44–1.00)
GFR, Estimated: >60 mL/min (ref >60)
Glucose, Bld: 93 mg/dL (ref 70–99)
Potassium: 3.8 mmol/L (ref 3.5–5.1)
Sodium: 141 mmol/L (ref 135–145)
Total Bilirubin: 0.4 mg/dL (ref 0.3–1.2)
Total Protein: 7.7 g/dL (ref 6.5–8.1)

## 2022-01-12 LAB — CBC WITH DIFFERENTIAL (CANCER CENTER ONLY)
Abs Immature Granulocytes: 0.01 10*3/uL (ref 0.00–0.07)
Basophils Absolute: 0 10*3/uL (ref 0.0–0.1)
Basophils Relative: 1 %
Eosinophils Absolute: 0.1 10*3/uL (ref 0.0–0.5)
Eosinophils Relative: 2 %
HCT: 41.4 % (ref 36.0–46.0)
Hemoglobin: 12.7 g/dL (ref 12.0–15.0)
Immature Granulocytes: 0 %
Lymphocytes Relative: 45 %
Lymphs Abs: 1.9 10*3/uL (ref 0.7–4.0)
MCH: 23 pg — ABNORMAL LOW (ref 26.0–34.0)
MCHC: 30.7 g/dL (ref 30.0–36.0)
MCV: 75.1 fL — ABNORMAL LOW (ref 80.0–100.0)
Monocytes Absolute: 0.4 10*3/uL (ref 0.1–1.0)
Monocytes Relative: 8 %
Neutro Abs: 1.9 10*3/uL (ref 1.7–7.7)
Neutrophils Relative %: 44 %
Platelet Count: 253 10*3/uL (ref 150–400)
RBC: 5.51 MIL/uL — ABNORMAL HIGH (ref 3.87–5.11)
RDW: 13.2 % (ref 11.5–15.5)
WBC Count: 4.3 10*3/uL (ref 4.0–10.5)
nRBC: 0 % (ref 0.0–0.2)

## 2022-01-12 LAB — C-REACTIVE PROTEIN: CRP: <0.5 mg/dL (ref <1.0)

## 2022-01-12 LAB — IRON AND IRON BINDING CAPACITY (CC-WL,HP ONLY)
Iron: 100 ug/dL (ref 28–170)
Saturation Ratios: 30 % (ref 10.4–31.8)
TIBC: 329 ug/dL (ref 250–450)
UIBC: 229 ug/dL (ref 148–442)

## 2022-01-12 LAB — SEDIMENTATION RATE: Sed Rate: 31 mm/hr — ABNORMAL HIGH (ref 0–22)

## 2022-01-12 LAB — FERRITIN: Ferritin: 66 ng/mL (ref 11–307)

## 2022-01-17 ENCOUNTER — Telehealth: Payer: Self-pay | Admitting: *Deleted

## 2022-01-17 DIAGNOSIS — S46012D Strain of muscle(s) and tendon(s) of the rotator cuff of left shoulder, subsequent encounter: Secondary | ICD-10-CM | POA: Diagnosis not present

## 2022-01-17 DIAGNOSIS — M542 Cervicalgia: Secondary | ICD-10-CM | POA: Diagnosis not present

## 2022-01-17 NOTE — Telephone Encounter (Signed)
-----   Message from Lincoln Brigham, PA-C sent at 01/17/2022 11:04 AM EDT ----- Please notify patient that WBC has resolved. There was slight elevation of one of her inflammatory markers which suggest possible inflammation that caused leukocytosis. Additionally, no evidence of iron deficiency. No further workup is required and s he can follow up with PCP.

## 2022-01-17 NOTE — Telephone Encounter (Signed)
Spoke with patient and discuss findings from [place], [place].   She expressed understanding and denied having any further questions at this time.

## 2022-01-24 DIAGNOSIS — S46012D Strain of muscle(s) and tendon(s) of the rotator cuff of left shoulder, subsequent encounter: Secondary | ICD-10-CM | POA: Diagnosis not present

## 2022-01-24 DIAGNOSIS — M542 Cervicalgia: Secondary | ICD-10-CM | POA: Diagnosis not present

## 2022-01-31 DIAGNOSIS — M542 Cervicalgia: Secondary | ICD-10-CM | POA: Diagnosis not present

## 2022-01-31 DIAGNOSIS — S46012D Strain of muscle(s) and tendon(s) of the rotator cuff of left shoulder, subsequent encounter: Secondary | ICD-10-CM | POA: Diagnosis not present

## 2022-02-07 DIAGNOSIS — S46012D Strain of muscle(s) and tendon(s) of the rotator cuff of left shoulder, subsequent encounter: Secondary | ICD-10-CM | POA: Diagnosis not present

## 2022-02-07 DIAGNOSIS — M542 Cervicalgia: Secondary | ICD-10-CM | POA: Diagnosis not present

## 2022-02-15 DIAGNOSIS — M542 Cervicalgia: Secondary | ICD-10-CM | POA: Diagnosis not present

## 2022-03-24 DIAGNOSIS — K219 Gastro-esophageal reflux disease without esophagitis: Secondary | ICD-10-CM | POA: Diagnosis not present

## 2022-03-24 DIAGNOSIS — I10 Essential (primary) hypertension: Secondary | ICD-10-CM | POA: Diagnosis not present

## 2022-03-24 DIAGNOSIS — D72829 Elevated white blood cell count, unspecified: Secondary | ICD-10-CM | POA: Diagnosis not present

## 2022-03-24 DIAGNOSIS — E782 Mixed hyperlipidemia: Secondary | ICD-10-CM | POA: Diagnosis not present

## 2022-09-01 ENCOUNTER — Ambulatory Visit (HOSPITAL_COMMUNITY)
Admission: RE | Admit: 2022-09-01 | Discharge: 2022-09-01 | Disposition: A | Discharge status: Home / Self Care | Payer: Self-pay | Source: Ambulatory Visit | Attending: Gerontology | Admitting: Gerontology

## 2022-09-01 ENCOUNTER — Other Ambulatory Visit (HOSPITAL_COMMUNITY): Payer: Self-pay | Admitting: Gerontology

## 2022-09-01 DIAGNOSIS — M545 Low back pain, unspecified: Secondary | ICD-10-CM | POA: Insufficient documentation

## 2022-09-15 ENCOUNTER — Ambulatory Visit: Payer: 59 | Admitting: Adult Health

## 2022-09-27 ENCOUNTER — Ambulatory Visit: Payer: Self-pay | Admitting: Adult Health

## 2022-10-06 ENCOUNTER — Encounter: Payer: Self-pay | Admitting: Adult Health

## 2022-10-06 ENCOUNTER — Ambulatory Visit (INDEPENDENT_AMBULATORY_CARE_PROVIDER_SITE_OTHER): Payer: PRIVATE HEALTH INSURANCE | Admitting: Adult Health

## 2022-10-06 VITALS — BP 120/70 | Temp 97.6°F | Resp 17 | Ht 64.0 in | Wt 176.2 lb

## 2022-10-06 DIAGNOSIS — I10 Essential (primary) hypertension: Secondary | ICD-10-CM

## 2022-10-06 DIAGNOSIS — Z13820 Encounter for screening for osteoporosis: Secondary | ICD-10-CM

## 2022-10-06 DIAGNOSIS — E785 Hyperlipidemia, unspecified: Secondary | ICD-10-CM | POA: Diagnosis not present

## 2022-10-06 DIAGNOSIS — M5441 Lumbago with sciatica, right side: Secondary | ICD-10-CM

## 2022-10-06 DIAGNOSIS — Z7689 Persons encountering health services in other specified circumstances: Secondary | ICD-10-CM | POA: Diagnosis not present

## 2022-10-06 DIAGNOSIS — K219 Gastro-esophageal reflux disease without esophagitis: Secondary | ICD-10-CM

## 2022-10-06 DIAGNOSIS — M159 Polyosteoarthritis, unspecified: Secondary | ICD-10-CM

## 2022-10-06 DIAGNOSIS — M5442 Lumbago with sciatica, left side: Secondary | ICD-10-CM

## 2022-10-06 DIAGNOSIS — Z8673 Personal history of transient ischemic attack (TIA), and cerebral infarction without residual deficits: Secondary | ICD-10-CM

## 2022-10-06 DIAGNOSIS — Z113 Encounter for screening for infections with a predominantly sexual mode of transmission: Secondary | ICD-10-CM

## 2022-10-06 DIAGNOSIS — G8929 Other chronic pain: Secondary | ICD-10-CM

## 2022-10-06 NOTE — Patient Instructions (Signed)
Preventive Care 40-54 Years Old, Female Preventive care refers to lifestyle choices and visits with your health care provider that can promote health and wellness. Preventive care visits are also called wellness exams. What can I expect for my preventive care visit? Counseling Your health care provider may ask you questions about your: Medical history, including: Past medical problems. Family medical history. Pregnancy history. Current health, including: Menstrual cycle. Method of birth control. Emotional well-being. Home life and relationship well-being. Sexual activity and sexual health. Lifestyle, including: Alcohol, nicotine or tobacco, and drug use. Access to firearms. Diet, exercise, and sleep habits. Work and work environment. Sunscreen use. Safety issues such as seatbelt and bike helmet use. Physical exam Your health care provider will check your: Height and weight. These may be used to calculate your BMI (body mass index). BMI is a measurement that tells if you are at a healthy weight. Waist circumference. This measures the distance around your waistline. This measurement also tells if you are at a healthy weight and may help predict your risk of certain diseases, such as type 2 diabetes and high blood pressure. Heart rate and blood pressure. Body temperature. Skin for abnormal spots. What immunizations do I need?  Vaccines are usually given at various ages, according to a schedule. Your health care provider will recommend vaccines for you based on your age, medical history, and lifestyle or other factors, such as travel or where you work. What tests do I need? Screening Your health care provider may recommend screening tests for certain conditions. This may include: Lipid and cholesterol levels. Diabetes screening. This is done by checking your blood sugar (glucose) after you have not eaten for a while (fasting). Pelvic exam and Pap test. Hepatitis B test. Hepatitis C  test. HIV (human immunodeficiency virus) test. STI (sexually transmitted infection) testing, if you are at risk. Lung cancer screening. Colorectal cancer screening. Mammogram. Talk with your health care provider about when you should start having regular mammograms. This may depend on whether you have a family history of breast cancer. BRCA-related cancer screening. This may be done if you have a family history of breast, ovarian, tubal, or peritoneal cancers. Bone density scan. This is done to screen for osteoporosis. Talk with your health care provider about your test results, treatment options, and if necessary, the need for more tests. Follow these instructions at home: Eating and drinking  Eat a diet that includes fresh fruits and vegetables, whole grains, lean protein, and low-fat dairy products. Take vitamin and mineral supplements as recommended by your health care provider. Do not drink alcohol if: Your health care provider tells you not to drink. You are pregnant, may be pregnant, or are planning to become pregnant. If you drink alcohol: Limit how much you have to 0-1 drink a day. Know how much alcohol is in your drink. In the [place] one drink equals one 12 oz bottle of beer (355 mL), one 5 oz glass of wine (148 mL), or one 1 oz glass of hard liquor (44 mL). Lifestyle Brush your teeth every morning and night with fluoride toothpaste. Floss one time each day. Exercise for at least 30 minutes 5 or more days each week. Do not use any products that contain nicotine or tobacco. These products include cigarettes, chewing tobacco, and vaping devices, such as e-cigarettes. If you need help quitting, ask your health care provider. Do not use drugs. If you are sexually active, practice safe sex. Use a condom or other form of protection to   prevent STIs. If you do not wish to become pregnant, use a form of birth control. If you plan to become pregnant, see your health care provider for a  prepregnancy visit. Take aspirin only as told by your health care provider. Make sure that you understand how much to take and what form to take. Work with your health care provider to find out whether it is safe and beneficial for you to take aspirin daily. Find healthy ways to manage stress, such as: Meditation, yoga, or listening to music. Journaling. Talking to a trusted person. Spending time with friends and family. Minimize exposure to UV radiation to reduce your risk of skin cancer. Safety Always wear your seat belt while driving or riding in a vehicle. Do not drive: If you have been drinking alcohol. Do not ride with someone who has been drinking. When you are tired or distracted. While texting. If you have been using any mind-altering substances or drugs. Wear a helmet and other protective equipment during sports activities. If you have firearms in your house, make sure you follow all gun safety procedures. Seek help if you have been physically or sexually abused. What's next? Visit your health care provider once a year for an annual wellness visit. Ask your health care provider how often you should have your eyes and teeth checked. Stay up to date on all vaccines. This information is not intended to replace advice given to you by your health care provider. Make sure you discuss any questions you have with your health care provider. Document Revised: 09/09/2020 Document Reviewed: 09/09/2020 Elsevier Patient Education  2024 [employer]  

## 2022-10-06 NOTE — Progress Notes (Signed)
[facility] clinic  Provider:  Kenard Gower DNP  Code Status:  Full Code  Goals of Care:     10/06/2022    2:08 PM  Advanced Directives  Does Patient Have a Medical Advance Directive? No     Chief Complaint  Patient presents with   New Patient (Initial Visit)    Patient is being seen to establish care    HPI: Patient is a 52 y.o. female seen today to establish care with [facility]. She was accompanied by her husband, who is also a patient at [facility], today. 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 She has up to first year college schooling. Her mother is alive and lives in a senior living housing and she has hypertension. Her father died at 4 Y/O and has COPD. She is an oly child. She has a son, 11 Y/O, and 4 grandchildren ( 3 boys and a girl). She does not smoke and drinks 750 ml of alcohol in a month. She walks for 35 minutes 2X/week as her exercise. When she drives, both legs get numb but once she moves, it resolves. She had colonoscopy 3 years ago and mammogram 05/2022. She declines pneumonia vaccine. She has occasional left shoulder and right elbow pain.   Past Medical History:  Diagnosis Date   Acid reflux    Anemia    seasonal   Heart murmur    from childhood.   Hyperlipidemia    Hypertension    Stroke [facility])    about 13 yrs ago had numbness on rt side but passed without further issues.     Past Surgical History:  Procedure Laterality Date   ABDOMINAL HYSTERECTOMY      Allergies  Allergen Reactions   Codeine Itching   Phenergan [Promethazine Hcl] Nausea And Vomiting   Sulfa Antibiotics Itching and Rash    Outpatient Encounter Medications as of 10/06/2022  Medication Sig   albuterol (VENTOLIN HFA) 108 (90 Base) MCG/ACT inhaler Inhale 2 puffs into the lungs every 6 (six) hours as needed.   amLODipine (NORVASC) 10 MG tablet Take 10 mg by mouth at bedtime.   ASPIRIN 81 PO Take by mouth.   diphenhydrAMINE HCl (BENADRYL PO) Take by mouth.    hydrochlorothiazide (HYDRODIURIL) 12.5 MG tablet Take 12.5 mg by mouth every morning.   metoprolol succinate (TOPROL-XL) 50 MG 24 hr tablet Take 50 mg by mouth daily. Take with or immediately following a meal.   Naproxen Sodium (ALEVE PO) Take by mouth.   omeprazole (PRILOSEC) 40 MG capsule Take 40 mg by mouth daily.   simvastatin (ZOCOR) 20 MG tablet Take 20 mg by mouth at bedtime.   gabapentin (NEURONTIN) 100 MG capsule Take 100 mg by mouth daily.   methocarbamol (ROBAXIN) 500 MG tablet Take 500 mg by mouth 3 (three) times daily.   Facility-Administered Encounter Medications as of 10/06/2022  Medication   0.9 %  sodium chloride infusion    Review of Systems:  Review of Systems  Constitutional:  Negative for appetite change, chills, fatigue and fever.  HENT:  Negative for congestion, hearing loss, rhinorrhea and sore throat.   Eyes: Negative.   Respiratory:  Negative for cough, shortness of breath and wheezing.   Cardiovascular:  Negative for chest pain, palpitations and leg swelling.  Gastrointestinal:  Negative for abdominal pain, constipation, diarrhea, nausea and vomiting.  Genitourinary:  Negative for dysuria.  Musculoskeletal:  Positive for arthralgias and back pain. Negative for myalgias.  Right elbow and left shoulder pain occasionally -  low back pain with numbness to bilateral lower extremities when sitting down  Skin:  Negative for color change, rash and wound.  Neurological:  Negative for dizziness, weakness and headaches.  Psychiatric/Behavioral:  Negative for behavioral problems. The patient is not nervous/anxious.     Health Maintenance  Topic Date Due   COVID-19 Vaccine (1) Never done   Hepatitis C Screening  Never done   DTaP/Tdap/Td (1 - Tdap) Never done   Zoster Vaccines- Shingrix (1 of 2) Never done   MAMMOGRAM  06/11/2022   INFLUENZA VACCINE  10/27/2022   Colonoscopy  03/25/2030   HIV Screening  Completed   HPV VACCINES  Aged Out   PAP SMEAR-Modifier   Discontinued    Physical Exam: Vitals:   10/06/22 1406  BP: 120/70  Resp: 17  Temp: 97.6 F (36.4 C)  TempSrc: Temporal  Weight: 176 lb 3.2 oz (79.9 kg)  Height: 5\' 4"  (1.626 m)   Body mass index is 30.24 kg/m. Physical Exam Constitutional:      Appearance: Normal appearance. She is obese.  HENT:     Head: Normocephalic and atraumatic.     Nose: Nose normal.     Mouth/Throat:     Mouth: Mucous membranes are moist.  Eyes:     Conjunctiva/sclera: Conjunctivae normal.  Cardiovascular:     Rate and Rhythm: Normal rate and regular rhythm.  Pulmonary:     Effort: Pulmonary effort is normal.     Breath sounds: Normal breath sounds.  Abdominal:     General: Bowel sounds are normal.     Palpations: Abdomen is soft.  Musculoskeletal:        General: Normal range of motion.     Cervical back: Normal range of motion.  Skin:    General: Skin is warm and dry.  Neurological:     General: No focal deficit present.     Mental Status: She is alert and oriented to person, place, and time.  Psychiatric:        Mood and Affect: Mood normal.        Behavior: Behavior normal.        Thought Content: Thought content normal.        Judgment: Judgment normal.    Labs reviewed: Basic Metabolic Panel: Recent Labs    01/12/22 0951  NA 141  K 3.8  CL 108  CO2 26  GLUCOSE 93  BUN 9  CREATININE 0.91  CALCIUM 9.3   Liver Function Tests: Recent Labs    01/12/22 0951  AST 16  ALT 17  ALKPHOS 79  BILITOT 0.4  PROT 7.7  ALBUMIN 4.3   No results for input(s): "LIPASE", "AMYLASE" in the last 8760 hours. No results for input(s): "AMMONIA" in the last 8760 hours. CBC: Recent Labs    01/12/22 0951  WBC 4.3  NEUTROABS 1.9  HGB 12.7  HCT 41.4  MCV 75.1*  PLT 253   Lipid Panel: No results for input(s): "CHOL", "HDL", "LDLCALC", "TRIG", "CHOLHDL", "LDLDIRECT" in the last 8760 hours. Lab Results  Component Value Date   HGBA1C 5.7 (H) 01/21/2013    Procedures since last  visit: No results found.  Assessment/Plan  1. Encounter to establish care -  established care with [facility]  2. Primary hypertension -  BP 120/70, stable -  continue amlodipine, Toprol XL, hydrochlorothiazide and Aldactone - CBC With Differential/Platelet; Future - Complete Metabolic Panel with eGFR; Future - Lipid panel;  Future - Hemoglobin A1C; Future  3. Gastroesophageal reflux disease without esophagitis -  stable -  continue Omeprazole  4. Hyperlipidemia, unspecified hyperlipidemia type -  continue Simvastatin  5. History of CVA (cerebrovascular accident) -  stable -  continue Simvastatin and ASA 81 mg - Lipid panel; Future - Hemoglobin A1C; Future  6. Primary osteoarthritis involving multiple joints -  continue - CBC With Differential/Platelet; Future - Complete Metabolic Panel with eGFR; Future  7. Chronic bilateral low back pain with bilateral sciatica -  bilateral feet gets numb when sitting down - Ambulatory referral to Orthopedic Surgery  8. Screen for STD (sexually transmitted disease) - Hep C Antibody; Future  9. Encounter for screening for osteoporosis - DG BONE DENSITY (DXA); Future     Labs/tests ordered:  CBC, CMP, Hep C antibody, A1C, lipid panel, bone density  Next appt:  Visit date not found

## 2022-10-07 ENCOUNTER — Other Ambulatory Visit: Payer: PRIVATE HEALTH INSURANCE

## 2022-10-07 DIAGNOSIS — M159 Polyosteoarthritis, unspecified: Secondary | ICD-10-CM

## 2022-10-07 DIAGNOSIS — I10 Essential (primary) hypertension: Secondary | ICD-10-CM

## 2022-10-07 DIAGNOSIS — Z113 Encounter for screening for infections with a predominantly sexual mode of transmission: Secondary | ICD-10-CM

## 2022-10-07 DIAGNOSIS — Z8673 Personal history of transient ischemic attack (TIA), and cerebral infarction without residual deficits: Secondary | ICD-10-CM

## 2022-10-07 LAB — CBC WITH DIFFERENTIAL/PLATELET
Absolute Monocytes: 352 cells/uL (ref 200–950)
Eosinophils Absolute: 102 cells/uL (ref 15–500)
Eosinophils Relative: 2 %
HCT: 42.2 % (ref 35.0–45.0)
MCHC: 31.3 g/dL — ABNORMAL LOW (ref 32.0–36.0)
MCV: 73.6 fL — ABNORMAL LOW (ref 80.0–100.0)
MPV: 11.5 fL (ref 7.5–12.5)
Monocytes Relative: 6.9 %
Total Lymphocyte: 37.6 %
WBC: 5.1 10*3/uL (ref 3.8–10.8)

## 2022-10-08 LAB — COMPLETE METABOLIC PANEL WITH GFR
AG Ratio: 1.4 (calc) (ref 1.0–2.5)
ALT: 16 U/L (ref 6–29)
AST: 15 U/L (ref 10–35)
Albumin: 4.3 g/dL (ref 3.6–5.1)
Alkaline phosphatase (APISO): 93 U/L (ref 37–153)
BUN/Creatinine Ratio: 11 (calc) (ref 6–22)
BUN: 12 mg/dL (ref 7–25)
CO2: 26 mmol/L (ref 20–32)
Calcium: 9.2 mg/dL (ref 8.6–10.4)
Chloride: 107 mmol/L (ref 98–110)
Creat: 1.11 mg/dL — ABNORMAL HIGH (ref 0.50–1.03)
Globulin: 3.1 g/dL (calc) (ref 1.9–3.7)
Glucose, Bld: 97 mg/dL (ref 65–99)
Potassium: 4.2 mmol/L (ref 3.5–5.3)
Sodium: 141 mmol/L (ref 135–146)
Total Bilirubin: 0.3 mg/dL (ref 0.2–1.2)
Total Protein: 7.4 g/dL (ref 6.1–8.1)
eGFR: 59 mL/min/{1.73_m2} — ABNORMAL LOW (ref >=60)

## 2022-10-08 LAB — LIPID PANEL
Cholesterol: 160 mg/dL (ref <200)
HDL: 43 mg/dL — ABNORMAL LOW (ref >=50)
LDL Cholesterol (Calc): 96 mg/dL (calc)
Non-HDL Cholesterol (Calc): 117 mg/dL (calc) (ref <130)
Total CHOL/HDL Ratio: 3.7 (calc) (ref <5.0)
Triglycerides: 117 mg/dL (ref <150)

## 2022-10-08 LAB — CBC WITH DIFFERENTIAL/PLATELET
Basophils Absolute: 31 cells/uL (ref 0–200)
Basophils Relative: 0.6 %
Hemoglobin: 13.2 g/dL (ref 11.7–15.5)
Lymphs Abs: 1918 cells/uL (ref 850–3900)
MCH: 23 pg — ABNORMAL LOW (ref 27.0–33.0)
Neutro Abs: 2698 cells/uL (ref 1500–7800)
Neutrophils Relative %: 52.9 %
Platelets: 249 10*3/uL (ref 140–400)
RBC: 5.73 10*6/uL — ABNORMAL HIGH (ref 3.80–5.10)
RDW: 14 % (ref 11.0–15.0)

## 2022-10-08 LAB — HEMOGLOBIN A1C
Hgb A1c MFr Bld: 6 % of total Hgb — ABNORMAL HIGH (ref <5.7)
Mean Plasma Glucose: 126 mg/dL
eAG (mmol/L): 7 mmol/L

## 2022-10-08 LAB — HEPATITIS C ANTIBODY: Hepatitis C Ab: NONREACTIVE

## 2022-10-09 NOTE — Progress Notes (Signed)
-   A1C ranging as prediabetes -  lipid panel normal, good -  Has chronic kidney disease stage 3a with GFR 59, electrolytes and liver enzymes within normal -  no anemia -  hep C negative

## 2022-10-18 ENCOUNTER — Ambulatory Visit: Payer: PRIVATE HEALTH INSURANCE | Admitting: Physical Medicine and Rehabilitation

## 2022-10-18 ENCOUNTER — Encounter: Payer: Self-pay | Admitting: Physical Medicine and Rehabilitation

## 2022-10-18 DIAGNOSIS — M7918 Myalgia, other site: Secondary | ICD-10-CM

## 2022-10-18 DIAGNOSIS — M5441 Lumbago with sciatica, right side: Secondary | ICD-10-CM

## 2022-10-18 DIAGNOSIS — R202 Paresthesia of skin: Secondary | ICD-10-CM | POA: Diagnosis not present

## 2022-10-18 DIAGNOSIS — M5442 Lumbago with sciatica, left side: Secondary | ICD-10-CM | POA: Diagnosis not present

## 2022-10-18 DIAGNOSIS — G8929 Other chronic pain: Secondary | ICD-10-CM

## 2022-10-18 DIAGNOSIS — M5416 Radiculopathy, lumbar region: Secondary | ICD-10-CM

## 2022-10-18 NOTE — Progress Notes (Signed)
Functional Pain Scale - descriptive words and definitions  Distracting (5)    Aware of pain/able to complete some ADL's but limited by pain/sleep is affected and active distractions are only slightly useful. Moderate range order  Average Pain 5  Lower back pain that radiates into both legs. Numbness in both legs

## 2022-10-18 NOTE — Progress Notes (Signed)
[name] - 52 y.o. female MRN [id number]  Date of birth: 1970-04-21  Office Visit Note: Visit Date: 10/18/2022 PCP: Gillis Santa, NP Referred by: Gillis Santa*  Subjective: Chief Complaint  Patient presents with   Lower Back - Pain   HPI: Robin Fuentes is a 52 y.o. female who comes in today per the request of Robin Fuentes for evaluation of chronic, worsening and severe bilateral lower back pain radiating to buttocks down to posterolateral thighs to knees, left greater than right. Also reports numbness and tingling from lower back down both legs, also reports to left lower extremity. Pain ongoing for about 1 year. Her pain worsens with prolonged sitting and standing. She describes pain as spasm and pinching sensation, currently rates as 5 out of 10. Some relief of pain with home exercise regimen, rest and use of medications. Recent lumbar x-rays exhibit minimal degenerative disc disease at L3-L4. No history of lumbar surgery/injections. 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 Patient denies focal weakness, numbness and tingling. No recent trauma or falls.    Oswestry Disability Index Score 24% 10 to 20 (40%) moderate disability: The patient experiences more pain and difficulty with sitting, lifting and standing. Travel and social life are more difficult, and they may be disabled from work. Personal care, sexual activity and sleeping are not grossly affected, and the patient can usually be managed by conservative means.  Review of Systems  Musculoskeletal:  Positive for back pain.  Neurological:  Positive for tingling. Negative for focal weakness and weakness.  All other systems reviewed and are negative.  Otherwise per HPI.  Assessment & Plan: Visit Diagnoses:    ICD-10-CM   1. Lumbar radiculopathy  M54.16 MR LUMBAR SPINE WO CONTRAST    Ambulatory referral to Physical Therapy    2. Chronic bilateral low back pain with bilateral sciatica   M54.42 MR LUMBAR SPINE WO CONTRAST   M54.41 Ambulatory referral to Physical Therapy   G89.29     3. Paresthesia of skin  R20.2 MR LUMBAR SPINE WO CONTRAST    Ambulatory referral to Physical Therapy    4. Myofascial pain syndrome  M79.18 Ambulatory referral to Physical Therapy       Plan: Findings:  Chronic, worsening and severe bilateral lower back pain radiating to buttocks down to posterolateral thighs to knees, left greater than right. Patient continues to have severe pain despite good conservative therapies such as home exercise regimen, rest and use of medications. Patients clinical presentation and exam are consistent with L5 nerve pattern. Next step is to obtain lumbar MRI imaging. Depending on result of imaging we discussed possibility of performing lumbar epidural steroid injection. I discussed injection procedure with patient today, she has no questions. Diffuse tenderness noted to thoracic and lumbar paraspinal regions upon exam today. I placed order for formal physical therapy, I do feel she would benefit from manual treatments and dry needling. I will have patient follow up for lumbar MRI review. No red flag symptoms noted upon exam today.     Meds & Orders: No orders of the defined types were placed in this encounter.   Orders Placed This Encounter  Procedures   MR LUMBAR SPINE WO CONTRAST   Ambulatory referral to Physical Therapy    Follow-up: Return for lumbar MRI review.   Procedures: No procedures performed      Clinical History: No specialty comments available.   She reports that she has never smoked. She has never  used smokeless tobacco.  Recent Labs    10/07/22 0806  HGBA1C 6.0*    Objective:  VS:  HT:    WT:   BMI:     BP:   HR: bpm  TEMP: ( )  RESP:  Physical Exam Vitals and nursing note reviewed.  HENT:     Head: Normocephalic and atraumatic.     Right Ear: External ear normal.     Left Ear: External ear normal.     Nose: Nose normal.      Mouth/Throat:     Mouth: Mucous membranes are moist.  Eyes:     Extraocular Movements: Extraocular movements intact.  Cardiovascular:     Rate and Rhythm: Normal rate.     Pulses: Normal pulses.  Pulmonary:     Effort: Pulmonary effort is normal.  Abdominal:     General: Abdomen is flat. There is no distension.  Musculoskeletal:        General: Tenderness present.     Cervical back: Normal range of motion.     Comments: Patient rises from seated position to standing without difficulty. Good lumbar range of motion. No pain noted with facet loading. 5/5 strength noted with bilateral hip flexion, knee flexion/extension, ankle dorsiflexion/plantarflexion and EHL. No clonus noted bilaterally. No pain upon palpation of greater trochanters. No pain with internal/external rotation of bilateral hips. Sensation intact bilaterally. Dysesthesias noted to bilateral L5 dermatome. Tenderness noted to bilateral thoracic and lumbar paraspinal regions upon exam today. Negative slump test bilaterally. Ambulates without aid, gait steady.     Skin:    General: Skin is warm and dry.     Capillary Refill: Capillary refill takes less than 2 seconds.  Neurological:     General: No focal deficit present.     Mental Status: She is alert and oriented to person, place, and time.  Psychiatric:        Mood and Affect: Mood normal.        Behavior: Behavior normal.     Ortho Exam  Imaging: No results found.  Past Medical/Family/Surgical/Social History: Medications & Allergies reviewed per EMR, new medications updated. Patient Active Problem List   Diagnosis Date Noted   Deep dyspareunia 08/12/2020   S/P hysterectomy 08/12/2020   Encounter for screening fecal occult blood testing 08/12/2020   Encounter for well woman exam with routine gynecological exam 08/12/2020   Right facial numbness 01/21/2013   HTN (hypertension) 01/21/2013   Past Medical History:  Diagnosis Date   Acid reflux    Anemia     seasonal   Heart murmur    from childhood.   Hyperlipidemia    Hypertension    Stroke [facility])    about 13 yrs ago had numbness on rt side but passed without further issues.    Family History  Problem Relation Age of Onset   Hyperlipidemia Mother    Hypertension Mother    Hypertension Father    Hyperlipidemia Father    COPD Father    Stomach cancer Maternal Grandmother    Stomach cancer Maternal Grandfather    Hypertension Son    Colon cancer Neg Hx    Colon polyps Neg Hx    Esophageal cancer Neg Hx    Rectal cancer Neg Hx    Past Surgical History:  Procedure Laterality Date   ABDOMINAL HYSTERECTOMY     Social History   Occupational History   Not on file  Tobacco Use   Smoking status: Never   Smokeless  tobacco: Never  Vaping Use   Vaping status: Never Used  Substance and Sexual Activity   Alcohol use: Yes    Comment: occasionally   Drug use: No   Sexual activity: Yes    Birth control/protection: Surgical    Comment: hyst

## 2022-10-21 ENCOUNTER — Encounter: Payer: Self-pay | Admitting: Adult Health

## 2022-10-21 ENCOUNTER — Ambulatory Visit (INDEPENDENT_AMBULATORY_CARE_PROVIDER_SITE_OTHER): Payer: PRIVATE HEALTH INSURANCE | Admitting: Adult Health

## 2022-10-21 VITALS — BP 122/88 | HR 70 | Temp 97.8°F | Resp 18 | Ht 64.0 in | Wt 175.6 lb

## 2022-10-21 DIAGNOSIS — R7303 Prediabetes: Secondary | ICD-10-CM

## 2022-10-21 DIAGNOSIS — I10 Essential (primary) hypertension: Secondary | ICD-10-CM

## 2022-10-21 DIAGNOSIS — E785 Hyperlipidemia, unspecified: Secondary | ICD-10-CM

## 2022-10-21 DIAGNOSIS — Z8673 Personal history of transient ischemic attack (TIA), and cerebral infarction without residual deficits: Secondary | ICD-10-CM | POA: Diagnosis not present

## 2022-10-21 DIAGNOSIS — M5441 Lumbago with sciatica, right side: Secondary | ICD-10-CM

## 2022-10-21 DIAGNOSIS — K219 Gastro-esophageal reflux disease without esophagitis: Secondary | ICD-10-CM

## 2022-10-21 DIAGNOSIS — Z6830 Body mass index (BMI) 30.0-30.9, adult: Secondary | ICD-10-CM

## 2022-10-21 DIAGNOSIS — G8929 Other chronic pain: Secondary | ICD-10-CM

## 2022-10-21 DIAGNOSIS — M5442 Lumbago with sciatica, left side: Secondary | ICD-10-CM | POA: Diagnosis not present

## 2022-10-21 DIAGNOSIS — E669 Obesity, unspecified: Secondary | ICD-10-CM

## 2022-10-21 NOTE — Progress Notes (Unsigned)
[facility] clinic  Provider:  Kenard Gower DNP  Code Status:  Full Code  Goals of Care:     10/21/2022    1:30 PM  Advanced Directives  Does Patient Have a Medical Advance Directive? No  Would patient like information on creating a medical advance directive? No - Patient declined     Chief Complaint  Patient presents with   Follow-up    2 weeks follow-up   Quality Metric Gaps    Needs to discuss Covid, DTAP and Shingrix vaccine.    HPI: Patient is a 52 y.o. female seen today for a 2-week follow up.   Primary hypertension -   Hyperlipidemia, unspecified hyperlipidemia type -  staying away from red meat, takes Simvastatin  History of CVA (cerebrovascular accident) -  no weakness, takes  ASA and Simvastatin  Chronic bilateral low back pain with bilateral sciatica -  followed up with orthopedics, for MRI of lumbar spine, for PT  Gastroesophageal reflux disease without esophagitis -  takes Omeprazole  Prediabetes - snacks a lot, chocolate candy, A1C 6.0  Wt Readings from Last 3 Encounters:  10/21/22 175 lb 9.6 oz (79.7 kg)  10/06/22 176 lb 3.2 oz (79.9 kg)  01/12/22 173 lb 8 oz (78.7 kg)      Past Medical History:  Diagnosis Date   Acid reflux    Anemia    seasonal   Heart murmur    from childhood.   Hyperlipidemia    Hypertension    Stroke [facility])    about 13 yrs ago had numbness on rt side but passed without further issues.     Past Surgical History:  Procedure Laterality Date   ABDOMINAL HYSTERECTOMY      Allergies  Allergen Reactions   Codeine Itching   Phenergan [Promethazine Hcl] Nausea And Vomiting   Sulfa Antibiotics Itching and Rash    Outpatient Encounter Medications as of 10/21/2022  Medication Sig   albuterol (VENTOLIN HFA) 108 (90 Base) MCG/ACT inhaler Inhale 2 puffs into the lungs every 6 (six) hours as needed.   amLODipine (NORVASC) 10 MG tablet Take 10 mg by mouth at bedtime.   ASPIRIN 81 PO Take by mouth.   diphenhydrAMINE HCl  (BENADRYL PO) Take by mouth.   hydrochlorothiazide (HYDRODIURIL) 12.5 MG tablet Take 12.5 mg by mouth every morning.   metoprolol succinate (TOPROL-XL) 50 MG 24 hr tablet Take 50 mg by mouth daily. Take with or immediately following a meal.   Naproxen Sodium (ALEVE PO) Take by mouth.   omeprazole (PRILOSEC) 40 MG capsule Take 40 mg by mouth daily.   simvastatin (ZOCOR) 20 MG tablet Take 20 mg by mouth at bedtime.   Facility-Administered Encounter Medications as of 10/21/2022  Medication   0.9 %  sodium chloride infusion    Review of Systems:  Review of Systems  Constitutional:  Negative for appetite change, chills, fatigue and fever.  HENT:  Negative for congestion, hearing loss, rhinorrhea and sore throat.   Eyes: Negative.   Respiratory:  Negative for cough, shortness of breath and wheezing.   Cardiovascular:  Negative for chest pain, palpitations and leg swelling.  Gastrointestinal:  Negative for abdominal pain, constipation, diarrhea, nausea and vomiting.  Genitourinary:  Negative for dysuria.  Musculoskeletal:  Negative for arthralgias, back pain and myalgias.  Skin:  Negative for color change, rash and wound.  Neurological:  Negative for dizziness, weakness and headaches.  Psychiatric/Behavioral:  Negative for behavioral problems. The patient is not nervous/anxious.  Health Maintenance  Topic Date Due   COVID-19 Vaccine (1) Never done   DTaP/Tdap/Td (1 - Tdap) Never done   Zoster Vaccines- Shingrix (1 of 2) Never done   INFLUENZA VACCINE  10/27/2022   MAMMOGRAM  05/29/2024   Colonoscopy  03/25/2030   Hepatitis C Screening  Completed   HIV Screening  Completed   HPV VACCINES  Aged Out   PAP SMEAR-Modifier  Discontinued    Physical Exam: Vitals:   10/21/22 1402  BP: 122/88  Pulse: 70  Resp: 18  Temp: 97.8 F (36.6 C)  SpO2: 96%  Weight: 175 lb 9.6 oz (79.7 kg)  Height: 5\' 4"  (1.626 m)   Body mass index is 30.14 kg/m. Physical Exam Constitutional:       General: She is not in acute distress.    Appearance: She is obese.  HENT:     Head: Normocephalic and atraumatic.     Nose: Nose normal.     Mouth/Throat:     Mouth: Mucous membranes are moist.  Eyes:     Conjunctiva/sclera: Conjunctivae normal.  Cardiovascular:     Rate and Rhythm: Normal rate and regular rhythm.  Pulmonary:     Effort: Pulmonary effort is normal.     Breath sounds: Normal breath sounds.  Abdominal:     General: Bowel sounds are normal.     Palpations: Abdomen is soft.  Musculoskeletal:        General: Normal range of motion.     Cervical back: Normal range of motion.  Skin:    General: Skin is warm and dry.  Neurological:     General: No focal deficit present.     Mental Status: She is alert and oriented to person, place, and time.  Psychiatric:        Mood and Affect: Mood normal.        Behavior: Behavior normal.        Thought Content: Thought content normal.        Judgment: Judgment normal.     Labs reviewed: Basic Metabolic Panel: Recent Labs    01/12/22 0951 10/07/22 0806  NA 141 141  K 3.8 4.2  CL 108 107  CO2 26 26  GLUCOSE 93 97  BUN 9 12  CREATININE 0.91 1.11*  CALCIUM 9.3 9.2   Liver Function Tests: Recent Labs    01/12/22 0951 10/07/22 0806  AST 16 15  ALT 17 16  ALKPHOS 79  --   BILITOT 0.4 0.3  PROT 7.7 7.4  ALBUMIN 4.3  --    No results for input(s): "LIPASE", "AMYLASE" in the last 8760 hours. No results for input(s): "AMMONIA" in the last 8760 hours. CBC: Recent Labs    01/12/22 0951 10/07/22 0806  WBC 4.3 5.1  NEUTROABS 1.9 2,698  HGB 12.7 13.2  HCT 41.4 42.2  MCV 75.1* 73.6*  PLT 253 249   Lipid Panel: Recent Labs    10/07/22 0806  CHOL 160  HDL 43*  LDLCALC 96  TRIG 725  CHOLHDL 3.7   Lab Results  Component Value Date   HGBA1C 6.0 (H) 10/07/2022    Procedures since last visit: No results found.  Assessment/Plan  1. Primary hypertension -  BP 122/88, stable -  continue current  medications  2. Hyperlipidemia, unspecified hyperlipidemia type Lab Results  Component Value Date   CHOL 160 10/07/2022   HDL 43 (L) 10/07/2022   LDLCALC 96 10/07/2022   TRIG 117 10/07/2022   CHOLHDL 3.7  10/07/2022    -  continue Simvastatin  3. History of CVA (cerebrovascular accident) -  stable -  continue ASA and Simvastatin  4. Chronic bilateral low back pain with bilateral sciatica -   for outpatient PT -   follows up with orthopedics -  continue Naproxen  5. Gastroesophageal reflux disease without esophagitis -  stable -  continue Omeprazole  6. Prediabetes Lab Results  Component Value Date   HGBA1C 6.0 (H) 10/07/2022   -  diet-controlled -  will cut on rice and sweets -  will increase exercise  7. Class 1 obesity with body mass index (BMI) of 30.0 to 30.9 in adult, unspecified obesity type, unspecified whether serious comorbidity present Body mass index is 30.14 kg/m.  -  counseled on diet and exercise    Labs/tests ordered:  None   Next appt:  Visit date not found

## 2022-10-23 ENCOUNTER — Ambulatory Visit
Admission: RE | Admit: 2022-10-23 | Discharge: 2022-10-23 | Disposition: A | Discharge status: Home / Self Care | Payer: PRIVATE HEALTH INSURANCE | Source: Ambulatory Visit | Attending: Physical Medicine and Rehabilitation | Admitting: Physical Medicine and Rehabilitation

## 2022-10-23 DIAGNOSIS — M5416 Radiculopathy, lumbar region: Secondary | ICD-10-CM

## 2022-10-23 DIAGNOSIS — G8929 Other chronic pain: Secondary | ICD-10-CM

## 2022-10-23 DIAGNOSIS — R202 Paresthesia of skin: Secondary | ICD-10-CM

## 2022-11-02 ENCOUNTER — Other Ambulatory Visit: Payer: Self-pay | Admitting: Physical Medicine and Rehabilitation

## 2022-11-02 DIAGNOSIS — M7918 Myalgia, other site: Secondary | ICD-10-CM

## 2022-11-02 DIAGNOSIS — M5416 Radiculopathy, lumbar region: Secondary | ICD-10-CM

## 2022-11-02 NOTE — Progress Notes (Signed)
Spoke with patient this afternoon regarding recent lumbar MRI imaging. No neural impingement, central canal stenosis or severe facet arthropathy. Would not recommend interventional spine procedures at this time. I placed new order for PT as she now as [employer]. She can follow up with [place] as needed.

## 2022-11-14 ENCOUNTER — Encounter: Payer: Self-pay | Admitting: Physical Therapy

## 2022-11-14 ENCOUNTER — Ambulatory Visit: Payer: 59 | Admitting: Physical Therapy

## 2022-11-14 ENCOUNTER — Other Ambulatory Visit: Payer: Self-pay

## 2022-11-14 DIAGNOSIS — M6281 Muscle weakness (generalized): Secondary | ICD-10-CM | POA: Diagnosis not present

## 2022-11-14 DIAGNOSIS — R293 Abnormal posture: Secondary | ICD-10-CM | POA: Diagnosis not present

## 2022-11-14 DIAGNOSIS — M5459 Other low back pain: Secondary | ICD-10-CM

## 2022-11-14 NOTE — Therapy (Signed)
OUTPATIENT PHYSICAL THERAPY EVALUATION   Patient Name: Robin Fuentes MRN: [id number] DOB:November 27, 1970, 52 y.o., female Today's Date: 11/14/2022  END OF SESSION:  PT End of Session - 11/14/22 0923     Visit Number 1    Number of Visits 6    Date for PT Re-Evaluation 12/26/22    Authorization Type [employer] $15 copay; 30 visit limit    PT Start Time 0930    PT Stop Time 1002    PT Time Calculation (min) 32 min    Activity Tolerance Patient tolerated treatment well    Behavior During Therapy WFL for tasks assessed/performed             Past Medical History:  Diagnosis Date   Acid reflux    Anemia    seasonal   Heart murmur    from childhood.   Hyperlipidemia    Hypertension    Stroke [facility])    about 13 yrs ago had numbness on rt side but passed without further issues.    Past Surgical History:  Procedure Laterality Date   ABDOMINAL HYSTERECTOMY     Patient Active Problem List   Diagnosis Date Noted   Deep dyspareunia 08/12/2020   S/P hysterectomy 08/12/2020   Encounter for screening fecal occult blood testing 08/12/2020   Encounter for well woman exam with routine gynecological exam 08/12/2020   Right facial numbness 01/21/2013   HTN (hypertension) 01/21/2013    PCP: Gillis Santa, NP  REFERRING PROVIDER: Juanda Chance, NP  REFERRING DIAG: M54.16 (ICD-10-CM) - Lumbar radiculopathy M79.18 (ICD-10-CM) - Myofascial pain syndrome  Rationale for Evaluation and Treatment: Rehabilitation  THERAPY DIAG:  Other low back pain - Plan: PT plan of care cert/re-cert  Muscle weakness (generalized) - Plan: PT plan of care cert/re-cert  Abnormal posture - Plan: PT plan of care cert/re-cert  ONSET DATE: 6 months   SUBJECTIVE:                                                                                                                                                                                           SUBJECTIVE STATEMENT: Pt c/o bil low back pain  that radiates down to knee, then also c/o numbness and tingling in Lt calf.  Pain is worse on the left. No known injury and reports pain has progressively gotten worse.  PERTINENT HISTORY:  HTN, hx CVA, obesity  PAIN:  Are you having pain? Yes: NPRS scale: 5 currently, up to 8, at best 5/10 Pain location: low back radiating into legs Pain description: stabbing Aggravating factors: walking, getting up after sitting 30 min or more; can feel  numbness with prolonged sitting Relieving factors: aleve  PRECAUTIONS:  None  RED FLAGS: None   WEIGHT BEARING RESTRICTIONS:  No  FALLS:  Has patient fallen in last 6 months? 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; no heavy lifting  PLOF:  Independent and Leisure: spend time with grandchildren and children; tries to walk about 30 min a day if weather allows  PATIENT GOALS:  Improve pain  NEXT MD VISIT: PRN   OBJECTIVE:   DIAGNOSTIC FINDINGS:  MRI: No neural impingement, central canal stenosis or severe facet arthropathy  PATIENT SURVEYS:  11/14/22: FOTO 62 (predicted 65)  COGNITIVE STATUS: Within functional limits for tasks assessed   SENSATION: WFL  POSTURE:  rounded shoulders, forward head, and increased lumbar lordosis  GAIT: 11/14/22 Comments: independent with amb  PALPATION: 11/14/22: tenderness to palpation along bil glute meds and piroformis Unable to tolerate palpation at T8/9  LUMBAR ROM:   ROM A/PROM  eval  Flexion WNL  Extension WNL  Right quadrant WNL  Left quadrant WNL   (Blank rows = not tested)     LOWER EXTREMITY MMT:    MMT Right eval Left eval  Hip flexion 5/5 5/5  Hip abduction 3/5 3/5  Knee flexion 5/5 5/5  Knee extension 5/5 5/5  Ankle dorsiflexion 5/5 5/5   (Blank rows = not  tested)    SPECIAL TESTS:  11/14/22: Lumbar Slump test: Negative bil   TREATMENT:                                                                                                                              DATE:  11/14/22 See HEP - reviewed each exercise with trial reps performed PRN for comprehension     PATIENT EDUCATION:  Education details: HEP Person educated: Patient Education method: Explanation, Demonstration, and Handouts Education comprehension: verbalized understanding, returned demonstration, and needs further education  HOME EXERCISE PROGRAM: Access Code: [email or web address] URL: [email or web address] Date: 11/14/2022 Prepared by: Moshe Cipro  Exercises - Hooklying Single Knee to Chest  - 2 x daily - 7 x weekly - 1 sets - 3 reps - 30 sec hold - Supine Double Knee to Chest  - 2 x daily - 7 x weekly - 1 sets - 3 reps - 30 sec hold - Supine Piriformis Stretch with Foot on Ground  - 2 x daily - 7 x weekly - 1 sets - 3 reps - 30 sec hold - Supine Posterior Pelvic Tilt  - 2 x daily - 7 x weekly - 1 sets - 10 reps - 5 sec hold - Supine Bridge  - 2 x daily - 7 x weekly - 1 sets - 10 reps - 5 sec hold - Sidelying Hip Circles  - 2 x daily - 7  x weekly - 1 sets - 10 circles each way   ASSESSMENT:  CLINICAL IMPRESSION: Patient is a 52 y.o. female who was seen today for physical therapy evaluation and treatment for low back pain with bil LE symptoms.  She demonstrates decreased strength, postural abnormalities and trigger points affecting functional mobility affecting functional mobility.  She will benefit from PT to address deficits listed.   OBJECTIVE IMPAIRMENTS: decreased mobility, difficulty walking, decreased strength, increased fascial restrictions, increased muscle spasms, postural dysfunction, and pain.   ACTIVITY LIMITATIONS: carrying, lifting, bending, sitting, standing, squatting, transfers, and locomotion level  PARTICIPATION LIMITATIONS: meal prep,  cleaning, laundry, driving, shopping, community activity, and occupation  PERSONAL FACTORS: 3+ comorbidities: HTN, hx CVA, obesity  are also affecting patient's functional outcome.   REHAB POTENTIAL: Good  CLINICAL DECISION MAKING: Evolving/moderate complexity  EVALUATION COMPLEXITY: Moderate   GOALS: Goals reviewed with patient? Yes  SHORT TERM GOALS: Target date: 12/05/2022  Independent with initial HEP Goal status: INITIAL   LONG TERM GOALS: Target date: 12/26/2022  Independent with final HEP Goal status: INITIAL  2.  FOTO score improved to 65 Goal status: INITIAL  3.  Bil hip strength improved to 4+/5 for improved function and mobility Goal status: INIITAL  4.  Report pain < 3/10 with walking and transitional movements for improved function Goal status: INITIAL    PLAN:  PT FREQUENCY: 1x/week (recommend 2x/wk; pt requesting 1x/wk)  PT DURATION: 6 weeks  PLANNED INTERVENTIONS: Therapeutic exercises, Therapeutic activity, Neuromuscular re-education, Gait training, Patient/Family education, Self Care, Joint mobilization, Stair training, Aquatic Therapy, Dry Needling, Electrical stimulation, Spinal manipulation, Spinal mobilization, Cryotherapy, Moist heat, Taping, Traction, Manual therapy, and Re-evaluation.  PLAN FOR NEXT SESSION: ?possible traction, review HEP, hip/core strengthening, manual/modalities/DN PRN   Clarita Crane, PT, DPT 11/14/22 10:38 AM

## 2022-11-23 ENCOUNTER — Encounter: Payer: Self-pay | Admitting: Physical Therapy

## 2022-11-23 ENCOUNTER — Ambulatory Visit: Payer: 59 | Admitting: Physical Therapy

## 2022-11-23 DIAGNOSIS — R293 Abnormal posture: Secondary | ICD-10-CM

## 2022-11-23 DIAGNOSIS — M5459 Other low back pain: Secondary | ICD-10-CM | POA: Diagnosis not present

## 2022-11-23 DIAGNOSIS — M6281 Muscle weakness (generalized): Secondary | ICD-10-CM

## 2022-11-23 NOTE — Therapy (Signed)
OUTPATIENT PHYSICAL THERAPY TREATMENT   Patient Name: Robin Fuentes MRN: [id number] DOB:Sep 19, 1970, 52 y.o., female Today's Date: 11/23/2022  END OF SESSION:  PT End of Session - 11/23/22 0927     Visit Number 2    Number of Visits 6    Date for PT Re-Evaluation 12/26/22    Authorization Type [employer] $15 copay; 30 visit limit    PT Start Time 0925    PT Stop Time 1005    PT Time Calculation (min) 40 min    Activity Tolerance Patient tolerated treatment well    Behavior During Therapy WFL for tasks assessed/performed              Past Medical History:  Diagnosis Date   Acid reflux    Anemia    seasonal   Heart murmur    from childhood.   Hyperlipidemia    Hypertension    Stroke [facility])    about 13 yrs ago had numbness on rt side but passed without further issues.    Past Surgical History:  Procedure Laterality Date   ABDOMINAL HYSTERECTOMY     Patient Active Problem List   Diagnosis Date Noted   Deep dyspareunia 08/12/2020   S/P hysterectomy 08/12/2020   Encounter for screening fecal occult blood testing 08/12/2020   Encounter for well woman exam with routine gynecological exam 08/12/2020   Right facial numbness 01/21/2013   HTN (hypertension) 01/21/2013    PCP: Gillis Santa, NP  REFERRING PROVIDER: Juanda Chance, NP  REFERRING DIAG: M54.16 (ICD-10-CM) - Lumbar radiculopathy M79.18 (ICD-10-CM) - Myofascial pain syndrome  Rationale for Evaluation and Treatment: Rehabilitation  THERAPY DIAG:  Other low back pain  Muscle weakness (generalized)  Abnormal posture  ONSET DATE: 6 months   SUBJECTIVE:                                                                                                                                                                                           SUBJECTIVE STATEMENT: Only did her exercises once since eval; pain is about the same.   PERTINENT HISTORY:  HTN, hx CVA, obesity  PAIN:  Are you having  pain? Yes: NPRS scale: 5 currently, up to 8, at best 5/10 Pain location: low back radiating into legs Pain description: stabbing Aggravating factors: walking, getting up after sitting 30 min or more; can feel numbness with prolonged sitting Relieving factors: aleve  PRECAUTIONS:  None  RED FLAGS: None   WEIGHT BEARING RESTRICTIONS:  No  FALLS:  Has patient fallen in last 6 months? 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; no heavy lifting  PLOF:  Independent and Leisure: spend time with grandchildren and children; tries to walk about 30 min a day if weather allows  PATIENT GOALS:  Improve pain  NEXT MD VISIT: PRN   OBJECTIVE:   DIAGNOSTIC FINDINGS:  MRI: No neural impingement, central canal stenosis or severe facet arthropathy  PATIENT SURVEYS:  11/14/22: FOTO 62 (predicted 65)  COGNITIVE STATUS: Within functional limits for tasks assessed   SENSATION: WFL  POSTURE:  rounded shoulders, forward head, and increased lumbar lordosis  GAIT: 11/14/22 Comments: independent with amb  PALPATION: 11/14/22: tenderness to palpation along bil glute meds and piroformis Unable to tolerate palpation at T8/9  LUMBAR ROM:   ROM A/PROM  eval  Flexion WNL  Extension WNL  Right quadrant WNL  Left quadrant WNL   (Blank rows = not tested)     LOWER EXTREMITY MMT:    MMT Right eval Left eval  Hip flexion 5/5 5/5  Hip abduction 3/5 3/5  Knee flexion 5/5 5/5  Knee extension 5/5 5/5  Ankle dorsiflexion 5/5 5/5   (Blank rows = not tested)    SPECIAL TESTS:  11/14/22: Lumbar Slump test: Negative bil   TREATMENT:                                                                                                                              DATE:  11/23/22 TherEx NuStep  L6 x 8 min SKTC 3x30 sec bil DKTC 3x30 sec Supine piriformis stretch 3x30 sec Posterior pelvic tilt x10 reps Bridges x 10 reps Sidelying hip circles x 10 each direction; bil   11/14/22 See HEP - reviewed each exercise with trial reps performed PRN for comprehension     PATIENT EDUCATION:  Education details: HEP Person educated: Patient Education method: Explanation, Demonstration, and Handouts Education comprehension: verbalized understanding, returned demonstration, and needs further education  HOME EXERCISE PROGRAM: Access Code: [email or web address] URL: [email or web address] Date: 11/14/2022 Prepared by: Moshe Cipro  Exercises - Hooklying Single Knee to Chest  - 2 x daily - 7 x weekly - 1 sets - 3 reps - 30 sec hold - Supine Double Knee to Chest  - 2 x daily - 7 x weekly - 1 sets - 3 reps - 30 sec hold - Supine Piriformis Stretch with Foot on Ground  - 2 x daily - 7 x weekly - 1 sets - 3 reps - 30 sec hold - Supine Posterior Pelvic Tilt  - 2 x daily - 7 x weekly - 1 sets - 10 reps - 5 sec hold - Supine Bridge  - 2 x daily - 7 x weekly - 1 sets - 10 reps - 5 sec hold - Sidelying Hip Circles  - 2 x daily - 7 x weekly - 1 sets -  10 circles each way   ASSESSMENT:  CLINICAL IMPRESSION: Session today focused on review of HEP which pt demonstrates good initial understanding despite limited compliance.  Discussed importance of working HEP into each day to determine effectiveness.  Will continue to benefit from PT to maximize function.   OBJECTIVE IMPAIRMENTS: decreased mobility, difficulty walking, decreased strength, increased fascial restrictions, increased muscle spasms, postural dysfunction, and pain.   ACTIVITY LIMITATIONS: carrying, lifting, bending, sitting, standing, squatting, transfers, and locomotion level  PARTICIPATION LIMITATIONS: meal prep, cleaning, laundry, driving, shopping, community activity, and occupation  PERSONAL FACTORS: 3+ comorbidities:  HTN, hx CVA, obesity  are also affecting patient's functional outcome.   REHAB POTENTIAL: Good  CLINICAL DECISION MAKING: Evolving/moderate complexity  EVALUATION COMPLEXITY: Moderate   GOALS: Goals reviewed with patient? Yes  SHORT TERM GOALS: Target date: 12/05/2022  Independent with initial HEP Goal status: INITIAL   LONG TERM GOALS: Target date: 12/26/2022  Independent with final HEP Goal status: INITIAL  2.  FOTO score improved to 65 Goal status: INITIAL  3.  Bil hip strength improved to 4+/5 for improved function and mobility Goal status: INIITAL  4.  Report pain < 3/10 with walking and transitional movements for improved function Goal status: INITIAL    PLAN:  PT FREQUENCY: 1x/week (recommend 2x/wk; pt requesting 1x/wk)  PT DURATION: 6 weeks  PLANNED INTERVENTIONS: Therapeutic exercises, Therapeutic activity, Neuromuscular re-education, Gait training, Patient/Family education, Self Care, Joint mobilization, Stair training, Aquatic Therapy, Dry Needling, Electrical stimulation, Spinal manipulation, Spinal mobilization, Cryotherapy, Moist heat, Taping, Traction, Manual therapy, and Re-evaluation.  PLAN FOR NEXT SESSION: percussive device?,  ?possible traction, review HEP PRN, hip/core strengthening, manual/modalities/DN PRN   Clarita Crane, PT, DPT 11/23/22 10:08 AM

## 2022-11-30 ENCOUNTER — Encounter: Payer: PRIVATE HEALTH INSURANCE | Admitting: Physical Therapy

## 2022-11-30 ENCOUNTER — Telehealth: Payer: Self-pay | Admitting: Physical Therapy

## 2022-11-30 NOTE — Telephone Encounter (Signed)
Spoke with pt who missed her PT appt today.  She reports she forgot to call to cancel, and "wasn't feeling it today."  Offered appointment for tomorrow, pt unable to attend.  Did put on wait list for Friday.  Clarita Crane, PT, DPT 11/30/22 10:18 AM  [facility] Physical Therapy [address] [place], [place], [address] Phone: [phone]   Fax:  [phone]

## 2022-12-07 ENCOUNTER — Ambulatory Visit: Payer: 59 | Admitting: Physical Therapy

## 2022-12-07 ENCOUNTER — Encounter: Payer: Self-pay | Admitting: Physical Therapy

## 2022-12-07 DIAGNOSIS — M6281 Muscle weakness (generalized): Secondary | ICD-10-CM

## 2022-12-07 DIAGNOSIS — R293 Abnormal posture: Secondary | ICD-10-CM | POA: Diagnosis not present

## 2022-12-07 DIAGNOSIS — M5459 Other low back pain: Secondary | ICD-10-CM

## 2022-12-07 NOTE — Therapy (Signed)
OUTPATIENT PHYSICAL THERAPY TREATMENT DISCHARGE SUMMARY   Patient Name: Robin Fuentes MRN: [id number] DOB:1970-12-16, 52 y.o., female Today's Date: 12/07/2022  END OF SESSION:  PT End of Session - 12/07/22 0927     Visit Number 3    Number of Visits 6    Date for PT Re-Evaluation 12/26/22    Authorization Type [employer] $15 copay; 30 visit limit    PT Start Time 0928    PT Stop Time 0955    PT Time Calculation (min) 27 min    Activity Tolerance Patient tolerated treatment well    Behavior During Therapy [facility] for tasks assessed/performed               Past Medical History:  Diagnosis Date   Acid reflux    Anemia    seasonal   Heart murmur    from childhood.   Hyperlipidemia    Hypertension    Stroke [facility]    about 13 yrs ago had numbness on rt side but passed without further issues.    Past Surgical History:  Procedure Laterality Date   ABDOMINAL HYSTERECTOMY     Patient Active Problem List   Diagnosis Date Noted   Deep dyspareunia 08/12/2020   S/P hysterectomy 08/12/2020   Encounter for screening fecal occult blood testing 08/12/2020   Encounter for well woman exam with routine gynecological exam 08/12/2020   Right facial numbness 01/21/2013   HTN (hypertension) 01/21/2013    PCP: Gillis Santa, NP  REFERRING PROVIDER: Juanda Chance, NP  REFERRING DIAG: M54.16 (ICD-10-CM) - Lumbar radiculopathy M79.18 (ICD-10-CM) - Myofascial pain syndrome  Rationale for Evaluation and Treatment: Rehabilitation  THERAPY DIAG:  Other low back pain  Muscle weakness (generalized)  Abnormal posture  ONSET DATE: 6 months   SUBJECTIVE:                                                                                                                                                                                           SUBJECTIVE STATEMENT: Doing pretty well; pain has been pretty minimal.  Trying to walk more with pain up to about a 3; doesn't need to  stop anymore.   PERTINENT HISTORY:  HTN, hx CVA, obesity  PAIN:  Are you having pain? Yes: NPRS scale: 0 currently; only complaining of stiffness/10 Pain location: low back radiating into legs Pain description: stabbing Aggravating factors: walking, getting up after sitting 30 min or more; can feel numbness with prolonged sitting Relieving factors: aleve  PRECAUTIONS:  None  RED FLAGS: None   WEIGHT BEARING RESTRICTIONS:  No  FALLS:  Has patient fallen in last  6 months? 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; no heavy lifting  PLOF:  Independent and Leisure: spend time with grandchildren and children; tries to walk about 30 min a day if weather allows  PATIENT GOALS:  Improve pain  NEXT MD VISIT: PRN   OBJECTIVE:   DIAGNOSTIC FINDINGS:  MRI: No neural impingement, central canal stenosis or severe facet arthropathy  PATIENT SURVEYS:  11/14/22: FOTO 62 (predicted 65) 12/07/22: FOTO 64  COGNITIVE STATUS: Within functional limits for tasks assessed   SENSATION: WFL  POSTURE:  rounded shoulders, forward head, and increased lumbar lordosis  GAIT: 11/14/22 Comments: independent with amb  PALPATION: 11/14/22: tenderness to palpation along bil glute meds and piroformis Unable to tolerate palpation at T8/9  LUMBAR ROM:   ROM A/PROM  eval  Flexion WNL  Extension WNL  Right quadrant WNL  Left quadrant WNL   (Blank rows = not tested)     LOWER EXTREMITY MMT:    MMT Right eval Left eval Rt/Lt 12/07/22  Hip flexion 5/5 5/5   Hip abduction 3/5 3/5 4+/5  Knee flexion 5/5 5/5   Knee extension 5/5 5/5   Ankle dorsiflexion 5/5 5/5    (Blank rows = not tested)    SPECIAL TESTS:  11/14/22: Lumbar Slump test: Negative bil   TREATMENT:                                                                                                                               DATE:  12/07/22 TherEx Recumbent bike L4 x 8 min MMT - see above for details Discussed continuation of walking program as well as continued exercise - pt verbalized understanding  11/23/22 TherEx NuStep L6 x 8 min SKTC 3x30 sec bil DKTC 3x30 sec Supine piriformis stretch 3x30 sec Posterior pelvic tilt x10 reps Bridges x 10 reps Sidelying hip circles x 10 each direction; bil   11/14/22 See HEP - reviewed each exercise with trial reps performed PRN for comprehension     PATIENT EDUCATION:  Education details: HEP Person educated: Patient Education method: Explanation, Demonstration, and Handouts Education comprehension: verbalized understanding, returned demonstration, and needs further education  HOME EXERCISE PROGRAM: Access Code: [email or web address] URL: [email or web address] Date: 11/14/2022 Prepared by: Moshe Cipro  Exercises - Hooklying Single Knee to Chest  - 2 x daily - 7 x weekly - 1 sets - 3 reps - 30 sec hold - Supine Double Knee to Chest  - 2 x daily - 7 x weekly - 1 sets - 3 reps - 30 sec hold - Supine Piriformis Stretch with Foot on Ground  - 2 x daily - 7 x weekly - 1 sets - 3 reps - 30 sec hold - Supine Posterior Pelvic Tilt  - 2 x daily -  7 x weekly - 1 sets - 10 reps - 5 sec hold - Supine Bridge  - 2 x daily - 7 x weekly - 1 sets - 10 reps - 5 sec hold - Sidelying Hip Circles  - 2 x daily - 7 x weekly - 1 sets - 10 circles each way   ASSESSMENT:  CLINICAL IMPRESSION: Pt has met all goals and is ready for d/c at this time.  She has app based strengthening program and HEP as well as continues to walk daily.  Will d/c PT today.  OBJECTIVE IMPAIRMENTS: decreased mobility, difficulty walking, decreased strength, increased fascial restrictions, increased muscle spasms, postural dysfunction, and pain.   ACTIVITY LIMITATIONS: carrying, lifting,  bending, sitting, standing, squatting, transfers, and locomotion level  PARTICIPATION LIMITATIONS: meal prep, cleaning, laundry, driving, shopping, community activity, and occupation  PERSONAL FACTORS: 3+ comorbidities: HTN, hx CVA, obesity  are also affecting patient's functional outcome.   REHAB POTENTIAL: Good  CLINICAL DECISION MAKING: Evolving/moderate complexity  EVALUATION COMPLEXITY: Moderate   GOALS: Goals reviewed with patient? Yes  SHORT TERM GOALS: Target date: 12/05/2022  Independent with initial HEP Goal status: MET 12/07/22   LONG TERM GOALS: Target date: 12/26/2022  Independent with final HEP Goal status: MET 12/07/22  2.  FOTO score improved to 65 Goal status: PARTIALLY MET 12/07/22  3.  Bil hip strength improved to 4+/5 for improved function and mobility Goal status: MET 12/07/22  4.  Report pain < 3/10 with walking and transitional movements for improved function Goal status: MET 12/07/22    PLAN:  PT FREQUENCY: 1x/week (recommend 2x/wk; pt requesting 1x/wk)  PT DURATION: 6 weeks  PLANNED INTERVENTIONS: Therapeutic exercises, Therapeutic activity, Neuromuscular re-education, Gait training, Patient/Family education, Self Care, Joint mobilization, Stair training, Aquatic Therapy, Dry Needling, Electrical stimulation, Spinal manipulation, Spinal mobilization, Cryotherapy, Moist heat, Taping, Traction, Manual therapy, and Re-evaluation.  PLAN FOR NEXT SESSION: d/c PT today   Clarita Crane, PT, DPT 12/07/22 10:01 AM     PHYSICAL THERAPY DISCHARGE SUMMARY  Visits from Start of Care: 3  Current functional level related to goals / functional outcomes: See above   Remaining deficits: See above   Education / Equipment: HEP   Patient agrees to discharge. Patient goals were met. Patient is being discharged due to meeting the stated rehab goals.   Clarita Crane, PT, DPT 12/07/22 10:01 AM  [facility] Physical  Therapy [address] [place], [place], [address] Phone: [phone]   Fax:  [phone]

## 2022-12-14 ENCOUNTER — Encounter: Payer: PRIVATE HEALTH INSURANCE | Admitting: Physical Therapy

## 2023-01-12 DIAGNOSIS — Z1231 Encounter for screening mammogram for malignant neoplasm of breast: Secondary | ICD-10-CM | POA: Diagnosis not present

## 2023-01-12 LAB — HM MAMMOGRAPHY

## 2023-04-24 ENCOUNTER — Encounter: Payer: Self-pay | Admitting: Adult Health

## 2023-04-24 ENCOUNTER — Ambulatory Visit: Payer: 59 | Admitting: Adult Health

## 2023-04-24 VITALS — BP 121/78 | HR 74 | Temp 97.5°F | Resp 18 | Ht 64.0 in | Wt 177.8 lb

## 2023-04-24 DIAGNOSIS — F5101 Primary insomnia: Secondary | ICD-10-CM | POA: Diagnosis not present

## 2023-04-24 DIAGNOSIS — R7611 Nonspecific reaction to tuberculin skin test without active tuberculosis: Secondary | ICD-10-CM

## 2023-04-24 DIAGNOSIS — E785 Hyperlipidemia, unspecified: Secondary | ICD-10-CM

## 2023-04-24 DIAGNOSIS — M150 Primary generalized (osteo)arthritis: Secondary | ICD-10-CM

## 2023-04-24 DIAGNOSIS — K219 Gastro-esophageal reflux disease without esophagitis: Secondary | ICD-10-CM

## 2023-04-24 DIAGNOSIS — Z8673 Personal history of transient ischemic attack (TIA), and cerebral infarction without residual deficits: Secondary | ICD-10-CM

## 2023-04-24 DIAGNOSIS — I10 Essential (primary) hypertension: Secondary | ICD-10-CM | POA: Diagnosis not present

## 2023-04-24 DIAGNOSIS — Z6830 Body mass index (BMI) 30.0-30.9, adult: Secondary | ICD-10-CM

## 2023-04-24 DIAGNOSIS — G8929 Other chronic pain: Secondary | ICD-10-CM

## 2023-04-24 DIAGNOSIS — R7303 Prediabetes: Secondary | ICD-10-CM

## 2023-04-24 DIAGNOSIS — E66811 Obesity, class 1: Secondary | ICD-10-CM

## 2023-04-24 DIAGNOSIS — Z111 Encounter for screening for respiratory tuberculosis: Secondary | ICD-10-CM

## 2023-04-24 DIAGNOSIS — M549 Dorsalgia, unspecified: Secondary | ICD-10-CM

## 2023-04-24 MED ORDER — PANTOPRAZOLE SODIUM 40 MG PO TBEC: 40.0000 mg | DELAYED_RELEASE_TABLET | Freq: Every day | ORAL | 3 refills | Status: DC

## 2023-04-24 MED ORDER — GABAPENTIN 300 MG PO CAPS: 300.0000 mg | ORAL_CAPSULE | Freq: Every evening | ORAL | 3 refills | Status: DC

## 2023-04-24 NOTE — Progress Notes (Unsigned)
[facility] clinic  Provider:   Code Status: *** Goals of Care:     11/14/2022    9:32 AM  Advanced Directives  Does Patient Have a Medical Advance Directive? No  Would patient like information on creating a medical advance directive? No - Patient declined     Chief Complaint  Patient presents with   Medical Management of Chronic Issues    6 mth follow-up   Immunizations    DTAP, Shingrix, Influenza, and Covid     HPI: Patient is a 53 y.o. female seen today for an acute visit for     Past Medical History:  Diagnosis Date   Acid reflux    Anemia    seasonal   Heart murmur    from childhood.   Hyperlipidemia    Hypertension    Stroke [facility])    about 13 yrs ago had numbness on rt side but passed without further issues.     Past Surgical History:  Procedure Laterality Date   ABDOMINAL HYSTERECTOMY      Allergies  Allergen Reactions   Codeine Itching   Phenergan [Promethazine Hcl] Nausea And Vomiting   Sulfa Antibiotics Itching and Rash    Outpatient Encounter Medications as of 04/24/2023  Medication Sig   albuterol (VENTOLIN HFA) 108 (90 Base) MCG/ACT inhaler Inhale 2 puffs into the lungs every 6 (six) hours as needed.   amLODipine (NORVASC) 10 MG tablet Take 10 mg by mouth at bedtime.   ASPIRIN 81 PO Take by mouth.   diphenhydrAMINE HCl (BENADRYL PO) Take by mouth.   hydrochlorothiazide (HYDRODIURIL) 12.5 MG tablet Take 12.5 mg by mouth every morning.   metoprolol succinate (TOPROL-XL) 50 MG 24 hr tablet Take 50 mg by mouth daily. Take with or immediately following a meal.   Naproxen Sodium (ALEVE PO) Take by mouth.   omeprazole (PRILOSEC) 40 MG capsule Take 40 mg by mouth daily.   simvastatin (ZOCOR) 20 MG tablet Take 20 mg by mouth at bedtime.   Facility-Administered Encounter Medications as of 04/24/2023  Medication   0.9 %  sodium chloride infusion    Review of Systems:  Review of Systems  Constitutional:  Negative for appetite change, chills, fatigue  and fever.  HENT:  Negative for congestion, hearing loss, rhinorrhea and sore throat.   Eyes: Negative.   Respiratory:  Negative for cough, shortness of breath and wheezing.   Cardiovascular:  Negative for chest pain, palpitations and leg swelling.  Gastrointestinal:  Negative for abdominal pain, constipation, diarrhea, nausea and vomiting.  Genitourinary:  Negative for dysuria.  Musculoskeletal:  Negative for arthralgias, back pain and myalgias.  Skin:  Negative for color change, rash and wound.  Neurological:  Negative for dizziness, weakness and headaches.  Psychiatric/Behavioral:  Negative for behavioral problems. The patient is not nervous/anxious.     Health Maintenance  Topic Date Due   DTaP/Tdap/Td (1 - Tdap) Never done   Zoster Vaccines- Shingrix (1 of 2) Never done   INFLUENZA VACCINE  Never done   COVID-19 Vaccine (1 - 2024-25 season) Never done   MAMMOGRAM  01/11/2025   Colonoscopy  03/25/2030   Hepatitis C Screening  Completed   HIV Screening  Completed   HPV VACCINES  Aged Out    Physical Exam: Vitals:   04/24/23 1414  BP: 121/78  Pulse: 74  Resp: 18  Temp: (!) 97.5 F (36.4 C)  SpO2: 97%  Weight: 177 lb 12.8 oz (80.6 kg)  Height: 5\' 4"  (1.626 m)  Body mass index is 30.52 kg/m. Physical Exam Constitutional:      Appearance: Normal appearance.  HENT:     Head: Normocephalic and atraumatic.     Nose: Nose normal.     Mouth/Throat:     Mouth: Mucous membranes are moist.  Eyes:     Conjunctiva/sclera: Conjunctivae normal.  Cardiovascular:     Rate and Rhythm: Normal rate and regular rhythm.  Pulmonary:     Effort: Pulmonary effort is normal.     Breath sounds: Normal breath sounds.  Abdominal:     General: Bowel sounds are normal.     Palpations: Abdomen is soft.  Musculoskeletal:        General: Normal range of motion.     Cervical back: Normal range of motion.  Skin:    General: Skin is warm and dry.  Neurological:     General: No focal  deficit present.     Mental Status: She is alert and oriented to person, place, and time.  Psychiatric:        Mood and Affect: Mood normal.        Behavior: Behavior normal.        Thought Content: Thought content normal.        Judgment: Judgment normal.     Labs reviewed: Basic Metabolic Panel: Recent Labs    10/07/22 0806  NA 141  K 4.2  CL 107  CO2 26  GLUCOSE 97  BUN 12  CREATININE 1.11*  CALCIUM 9.2   Liver Function Tests: Recent Labs    10/07/22 0806  AST 15  ALT 16  BILITOT 0.3  PROT 7.4   No results for input(s): "LIPASE", "AMYLASE" in the last 8760 hours. No results for input(s): "AMMONIA" in the last 8760 hours. CBC: Recent Labs    10/07/22 0806  WBC 5.1  NEUTROABS 2,698  HGB 13.2  HCT 42.2  MCV 73.6*  PLT 249   Lipid Panel: Recent Labs    10/07/22 0806  CHOL 160  HDL 43*  LDLCALC 96  TRIG 161  CHOLHDL 3.7   Lab Results  Component Value Date   HGBA1C 6.0 (H) 10/07/2022    Procedures since last visit: No results found.  Assessment/Plan  Wt Readings from Last 3 Encounters:  04/24/23 177 lb 12.8 oz (80.6 kg)  10/21/22 175 lb 9.6 oz (79.7 kg)  10/06/22 176 lb 3.2 oz (79.9 kg)     Labs/tests ordered:    Next appt:  Visit date not found

## 2023-04-25 NOTE — Progress Notes (Signed)
-     A1C 6.2, up from 6.0 (taken 6 months ago), will need to have low carb diet and exercise at least 150 minutes/week, will re-check in 3 months -  electrolytes and kidney function all normal -  awaiting TB test result

## 2023-04-26 LAB — QUANTIFERON-TB GOLD PLUS
Mitogen-NIL: 8.49 [IU]/mL
NIL: 0.03 [IU]/mL
QuantiFERON-TB Gold Plus: NEGATIVE
TB1-NIL: <0 [IU]/mL
TB2-NIL: 0 [IU]/mL

## 2023-04-26 LAB — HEMOGLOBIN A1C
Hgb A1c MFr Bld: 6.2 %{Hb} — ABNORMAL HIGH (ref <5.7)
Mean Plasma Glucose: 131 mg/dL
eAG (mmol/L): 7.3 mmol/L

## 2023-04-26 LAB — BASIC METABOLIC PANEL WITH GFR
BUN: 11 mg/dL (ref 7–25)
CO2: 28 mmol/L (ref 20–32)
Calcium: 9.6 mg/dL (ref 8.6–10.4)
Chloride: 106 mmol/L (ref 98–110)
Creat: 0.87 mg/dL (ref 0.50–1.03)
Glucose, Bld: 106 mg/dL (ref 65–139)
Potassium: 4.2 mmol/L (ref 3.5–5.3)
Sodium: 143 mmol/L (ref 135–146)
eGFR: 79 mL/min/{1.73_m2} (ref >=60)

## 2023-04-26 NOTE — Progress Notes (Signed)
TB test negative

## 2023-06-30 ENCOUNTER — Other Ambulatory Visit: Payer: Self-pay | Admitting: Adult Health

## 2023-06-30 DIAGNOSIS — G8929 Other chronic pain: Secondary | ICD-10-CM

## 2023-06-30 DIAGNOSIS — K219 Gastro-esophageal reflux disease without esophagitis: Secondary | ICD-10-CM

## 2023-06-30 MED ORDER — GABAPENTIN 300 MG PO CAPS: 300.0000 mg | ORAL_CAPSULE | Freq: Every evening | ORAL | 1 refills | Status: DC

## 2023-06-30 MED ORDER — SIMVASTATIN 20 MG PO TABS: 20.0000 mg | ORAL_TABLET | Freq: Every day | ORAL | 0 refills | Status: DC

## 2023-06-30 MED ORDER — ALBUTEROL SULFATE HFA 108 (90 BASE) MCG/ACT IN AERS: 2.0000 | INHALATION_SPRAY | Freq: Four times a day (QID) | RESPIRATORY_TRACT | 1 refills | Status: DC | PRN

## 2023-06-30 MED ORDER — AMLODIPINE BESYLATE 10 MG PO TABS: 10.0000 mg | ORAL_TABLET | Freq: Every day | ORAL | 1 refills | Status: DC

## 2023-06-30 MED ORDER — METOPROLOL SUCCINATE ER 50 MG PO TB24
50.0000 mg | ORAL_TABLET | Freq: Every day | ORAL | 1 refills | Status: DC
Start: 2023-06-30 — End: 2023-08-16

## 2023-06-30 MED ORDER — HYDROCHLOROTHIAZIDE 12.5 MG PO TABS: 12.5000 mg | ORAL_TABLET | Freq: Every day | ORAL | 1 refills | Status: DC | PRN

## 2023-06-30 MED ORDER — PANTOPRAZOLE SODIUM 40 MG PO TBEC: 40.0000 mg | DELAYED_RELEASE_TABLET | Freq: Every day | ORAL | 1 refills | Status: DC

## 2023-06-30 NOTE — Telephone Encounter (Signed)
 Copied from CRM [id number]. Topic: Clinical - Medication Refill >> Jun 30, 2023  9:12 AM [name] wrote: Most Recent Primary Care Visit:  Provider: Kenard Gower C  Department: [facility]  Visit Type: OFFICE VISIT  Date: 04/24/2023  Medication:  albuterol (VENTOLIN HFA) 108 (90 Base) MCG/ACT inhaler gabapentin (NEURONTIN) 300 MG capsule amLODipine (NORVASC) 10 MG tablet hydrochlorothiazide (HYDRODIURIL) 12.5 MG tablet metoprolol succinate (TOPROL-XL) 50 MG 24 hr tablet pantoprazole (PROTONIX) 40 MG tablet simvastatin (ZOCOR) 20 MG tablet  Has the patient contacted their pharmacy? Yes (Agent: If yes, when and what did the pharmacy advise?)Patient was advised she needed to call her provider because some of the refills were under her previous provider  Is this the correct pharmacy for this prescription? Yes If no, delete pharmacy and type the correct one.  This is the patient's preferred pharmacy:  [facility] - [place] ([place]), [place] - [address] [place] ([place]) [place] [address] Phone: [phone] Fax: [phone]   Has the prescription been filled recently? No  Is the patient out of the medication? Yes  Has the patient been seen for an appointment in the last year OR does the patient have an upcoming appointment? Yes  Can we respond through MyChart? Yes  Agent: Please be advised that Rx refills may take up to 3 business days. We ask that you follow-up with your pharmacy.

## 2023-07-24 ENCOUNTER — Other Ambulatory Visit: Payer: 59

## 2023-07-24 DIAGNOSIS — E785 Hyperlipidemia, unspecified: Secondary | ICD-10-CM

## 2023-07-24 DIAGNOSIS — I10 Essential (primary) hypertension: Secondary | ICD-10-CM

## 2023-07-24 DIAGNOSIS — R7303 Prediabetes: Secondary | ICD-10-CM

## 2023-07-25 LAB — BASIC METABOLIC PANEL WITHOUT GFR
BUN/Creatinine Ratio: 10 (calc) (ref 6–22)
BUN: 11 mg/dL (ref 7–25)
CO2: 29 mmol/L (ref 20–32)
Calcium: 9.6 mg/dL (ref 8.6–10.4)
Chloride: 101 mmol/L (ref 98–110)
Creat: 1.05 mg/dL — ABNORMAL HIGH (ref 0.50–1.03)
Glucose, Bld: 106 mg/dL — ABNORMAL HIGH (ref 65–99)
Potassium: 4.3 mmol/L (ref 3.5–5.3)
Sodium: 138 mmol/L (ref 135–146)

## 2023-07-25 LAB — LIPID PANEL
Cholesterol: 166 mg/dL (ref <200)
HDL: 49 mg/dL — ABNORMAL LOW (ref >=50)
LDL Cholesterol (Calc): 97 mg/dL
Non-HDL Cholesterol (Calc): 117 mg/dL (ref <130)
Total CHOL/HDL Ratio: 3.4 (calc) (ref <5.0)
Triglycerides: 104 mg/dL (ref <150)

## 2023-07-25 LAB — HEMOGLOBIN A1C
Hgb A1c MFr Bld: 6.2 % — ABNORMAL HIGH (ref <5.7)
Mean Plasma Glucose: 131 mg/dL
eAG (mmol/L): 7.3 mmol/L

## 2023-07-25 NOTE — Progress Notes (Signed)
 Cholesterol, triglycerides and LDL all normal -Electrolytes all normal - A1c 6.2, same as 3 months ago, ranging as prediabetic

## 2023-08-16 ENCOUNTER — Other Ambulatory Visit: Payer: Self-pay | Admitting: Adult Health

## 2023-08-16 DIAGNOSIS — M549 Dorsalgia, unspecified: Secondary | ICD-10-CM

## 2023-08-16 DIAGNOSIS — K219 Gastro-esophageal reflux disease without esophagitis: Secondary | ICD-10-CM

## 2023-08-16 MED ORDER — SIMVASTATIN 20 MG PO TABS: 20.0000 mg | ORAL_TABLET | Freq: Every day | ORAL | 0 refills | Status: DC

## 2023-08-16 MED ORDER — ASPIRIN 81 MG PO CHEW: 81.0000 mg | CHEWABLE_TABLET | Freq: Every day | ORAL | 1 refills | Status: AC

## 2023-08-16 MED ORDER — NAPROXEN SODIUM 220 MG PO TABS: 220.0000 mg | ORAL_TABLET | Freq: Every day | ORAL | 1 refills | Status: AC | PRN

## 2023-08-16 MED ORDER — GABAPENTIN 300 MG PO CAPS: 300.0000 mg | ORAL_CAPSULE | Freq: Every evening | ORAL | 1 refills | Status: DC

## 2023-08-16 MED ORDER — PANTOPRAZOLE SODIUM 40 MG PO TBEC: 40.0000 mg | DELAYED_RELEASE_TABLET | Freq: Every day | ORAL | 1 refills | Status: AC

## 2023-08-16 MED ORDER — HYDROCHLOROTHIAZIDE 12.5 MG PO TABS: 12.5000 mg | ORAL_TABLET | Freq: Every day | ORAL | 1 refills | Status: AC | PRN

## 2023-08-16 MED ORDER — ALBUTEROL SULFATE HFA 108 (90 BASE) MCG/ACT IN AERS: 2.0000 | INHALATION_SPRAY | Freq: Four times a day (QID) | RESPIRATORY_TRACT | 1 refills | Status: AC | PRN

## 2023-08-16 MED ORDER — AMLODIPINE BESYLATE 10 MG PO TABS: 10.0000 mg | ORAL_TABLET | Freq: Every day | ORAL | 1 refills | Status: DC

## 2023-08-16 MED ORDER — METOPROLOL SUCCINATE ER 50 MG PO TB24: 50.0000 mg | ORAL_TABLET | Freq: Every day | ORAL | 1 refills | Status: AC

## 2023-08-16 NOTE — Telephone Encounter (Signed)
 Pharmacy requested refill.  Pended and sent to [facility] for approval.

## 2023-08-16 NOTE — Telephone Encounter (Signed)
 Copied from CRM [id number]. Topic: Clinical - Medication Refill >> Aug 16, 2023 12:47 PM Retta Caster wrote: Medication: albuterol  (VENTOLIN  HFA) 108 (90 Base) MCG/ACT inhaler amLODipine  (NORVASC ) 10 MG tablet ASPIRIN  81 PO gabapentin  (NEURONTIN ) 300 MG capsule metoprolol  succinate (TOPROL -XL) 50 MG 24 hr tablet Naproxen Sodium (ALEVE PO) pantoprazole  (PROTONIX ) 40 MG tablet simvastatin  (ZOCOR ) 20 MG tablet    Has the patient contacted their pharmacy? Yes (Agent: If no, request that the patient contact the pharmacy for the refill. If patient does not wish to contact the pharmacy document the reason why and proceed with request.) (Agent: If yes, when and what did the pharmacy advise?)  This is the patient's preferred pharmacy:  [facility] - [place] ([place]), [place] - [address] [place] ([place]) [place] [address] Phone: [phone] Fax: [phone]  Is this the correct pharmacy for this prescription? Yes If no, delete pharmacy and type the correct one.   Has the prescription been filled recently? Yes  Is the patient out of the medication? Yes  Has the patient been seen for an appointment in the last year OR does the patient have an upcoming appointment? Yes  Can we respond through MyChart? Yes  Agent: Please be advised that Rx refills may take up to 3 business days. We ask that you follow-up with your pharmacy.

## 2023-10-12 ENCOUNTER — Ambulatory Visit: Payer: Self-pay

## 2023-10-12 NOTE — Telephone Encounter (Signed)
 FYI Only or Action Required?: FYI only for provider.  Patient was last seen in primary care on 04/24/2023 by Medina-Vargas, Jereld BROCKS, NP.  Called Nurse Triage reporting Wrist Pain.  Symptoms began 1-2 months ago.  Interventions attempted: Rest, hydration, or home remedies.  Symptoms are: unchanged.  Triage Disposition: See PCP When Office is Open (Within 3 Days)  Patient/caregiver understands and will follow disposition?: No, wishes to speak with PCP    Copied from CRM [id number]. Topic: Clinical - Red Word Triage >> Oct 12, 2023 11:41 AM Mercer PEDLAR wrote: Red Word that prompted transfer to Nurse Triage: Wrist pain, knot on wrist.      Reason for Disposition  [1] MODERATE pain (e.g., interferes with normal activities) AND [2] present > 3 days  Answer Assessment - Initial Assessment Questions Patient declined an appointment with another provider and states she will wait to see her PCP on 7/28 as already scheduled.      1. ONSET: When did the pain start?     1-2 months  2. LOCATION: Where is the pain located?     Left wrist  3. PAIN: How bad is the pain? (Scale 1-10; or mild, moderate, severe)     5/10-7/10 4. WORK OR EXERCISE: Has there been any recent work or exercise that involved this part (i.e., hand or wrist) of the body?     No 5. CAUSE: What do you think is causing the pain?     Knot on left wrist  6. AGGRAVATING FACTORS: What makes the pain worse? (e.g., using computer)     Palpation exacerbates pain  7. OTHER SYMPTOMS: Do you have any other symptoms? (e.g., fever, neck pain, numbness or tingling, rash, swelling)     Pain radiates to thumb  Protocols used: Wrist Pain-A-AH

## 2023-10-23 ENCOUNTER — Ambulatory Visit (INDEPENDENT_AMBULATORY_CARE_PROVIDER_SITE_OTHER): Payer: No Typology Code available for payment source | Admitting: Adult Health

## 2023-10-23 ENCOUNTER — Encounter: Payer: Self-pay | Admitting: Adult Health

## 2023-10-23 VITALS — BP 135/76 | HR 75 | Temp 98.2°F | Resp 18 | Ht 64.0 in | Wt 171.6 lb

## 2023-10-23 DIAGNOSIS — R159 Full incontinence of feces: Secondary | ICD-10-CM

## 2023-10-23 DIAGNOSIS — M25532 Pain in left wrist: Secondary | ICD-10-CM

## 2023-10-23 DIAGNOSIS — R7303 Prediabetes: Secondary | ICD-10-CM | POA: Diagnosis not present

## 2023-10-23 DIAGNOSIS — F5101 Primary insomnia: Secondary | ICD-10-CM

## 2023-10-23 DIAGNOSIS — E785 Hyperlipidemia, unspecified: Secondary | ICD-10-CM | POA: Diagnosis not present

## 2023-10-23 DIAGNOSIS — I10 Essential (primary) hypertension: Secondary | ICD-10-CM

## 2023-10-23 DIAGNOSIS — M25551 Pain in right hip: Secondary | ICD-10-CM

## 2023-10-23 DIAGNOSIS — K219 Gastro-esophageal reflux disease without esophagitis: Secondary | ICD-10-CM

## 2023-10-23 DIAGNOSIS — M25552 Pain in left hip: Secondary | ICD-10-CM

## 2023-10-23 LAB — TSH: TSH: 0.66 m[IU]/L

## 2023-10-23 LAB — URIC ACID: Uric Acid, Serum: 4.2 mg/dL (ref 2.5–7.0)

## 2023-10-23 IMAGING — DX DG CERVICAL SPINE COMPLETE 4+V
5 series · 5 of 5 positions shown · non-contrast
Comparison: December 10, 2020

CLINICAL DATA: Neck pain radiating to the hands and shoulders.

EXAM:
CERVICAL SPINE - COMPLETE 4+ VIEW

[c-spine lat]
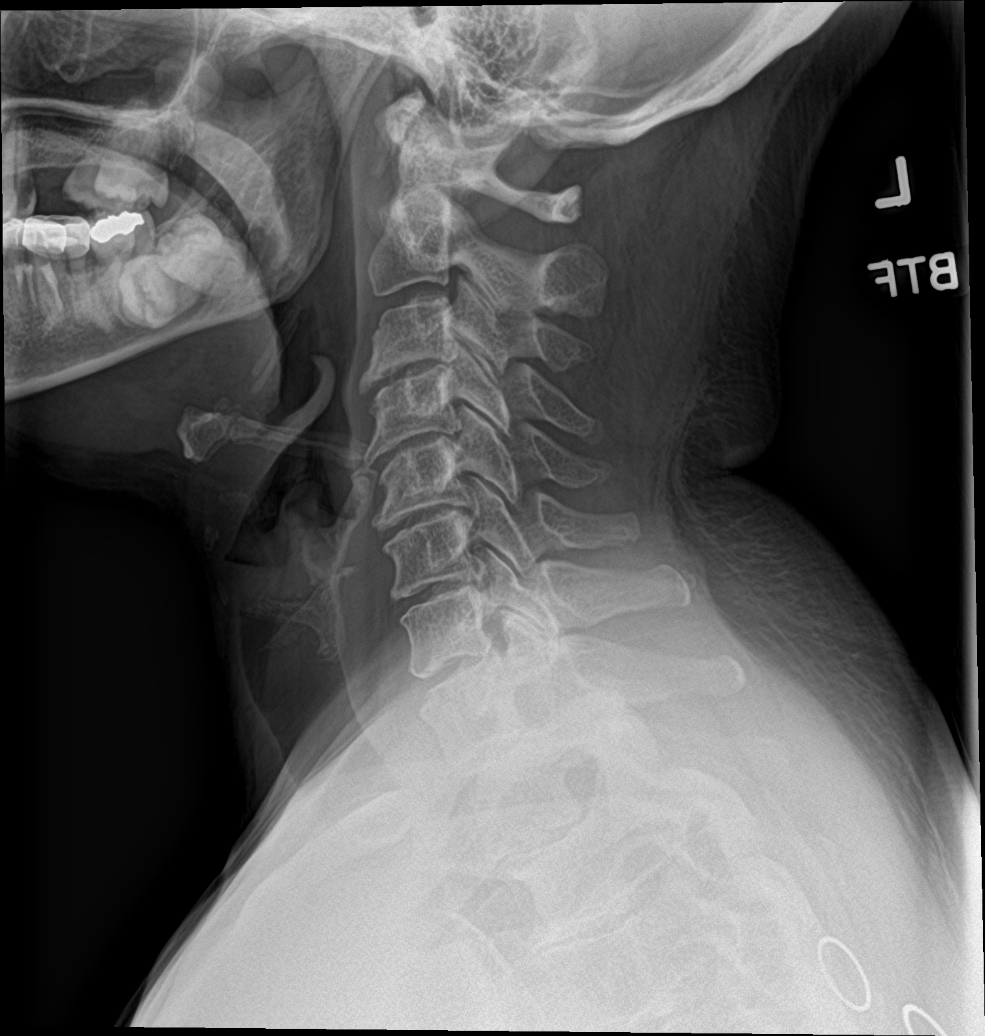

[c-spine obl (1 of 2)]
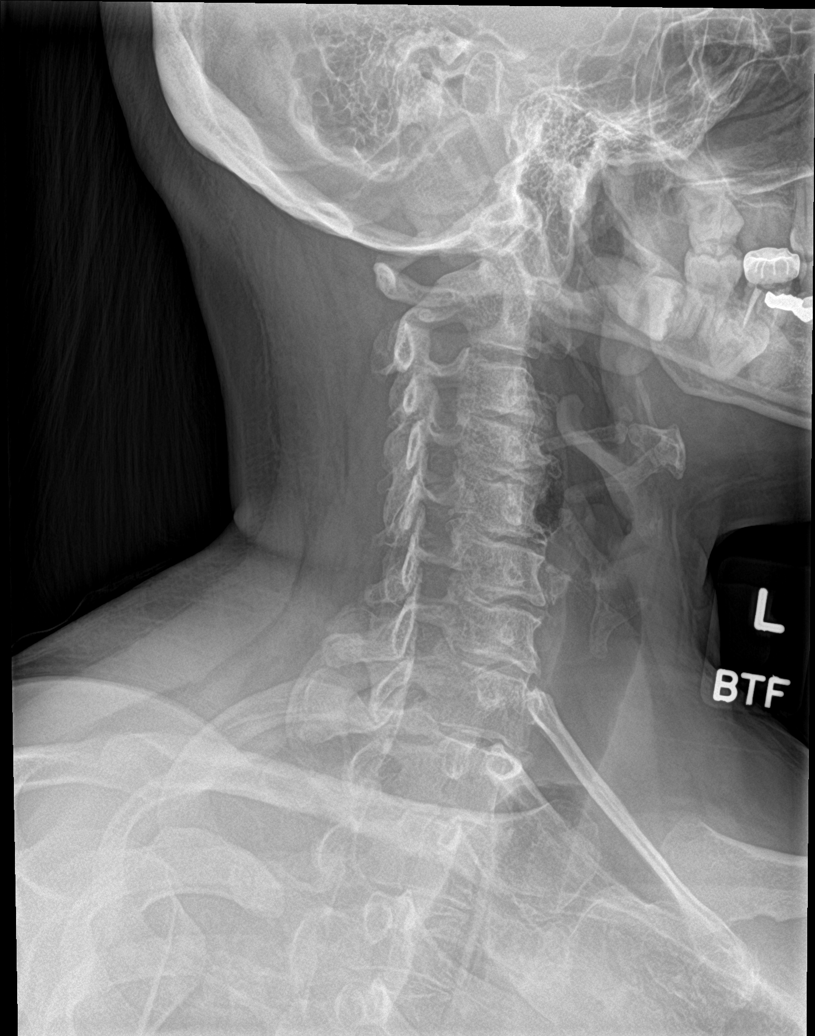

[c-spine obl (2 of 2)]
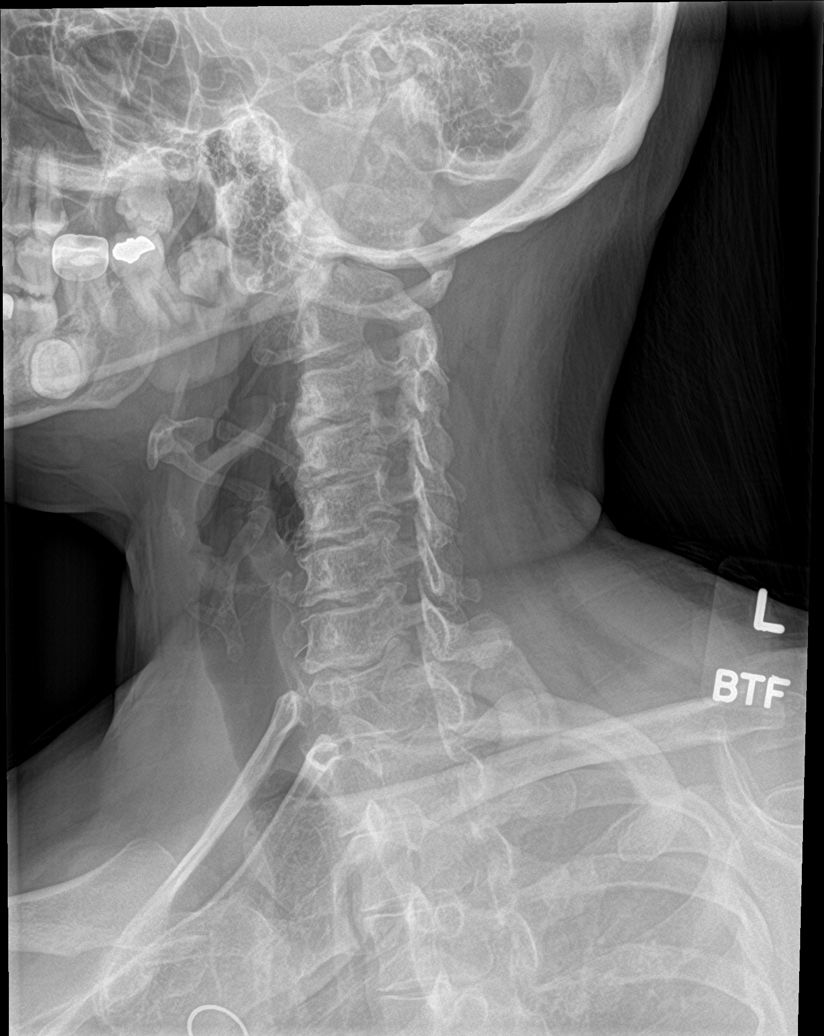

[c-spine ap]
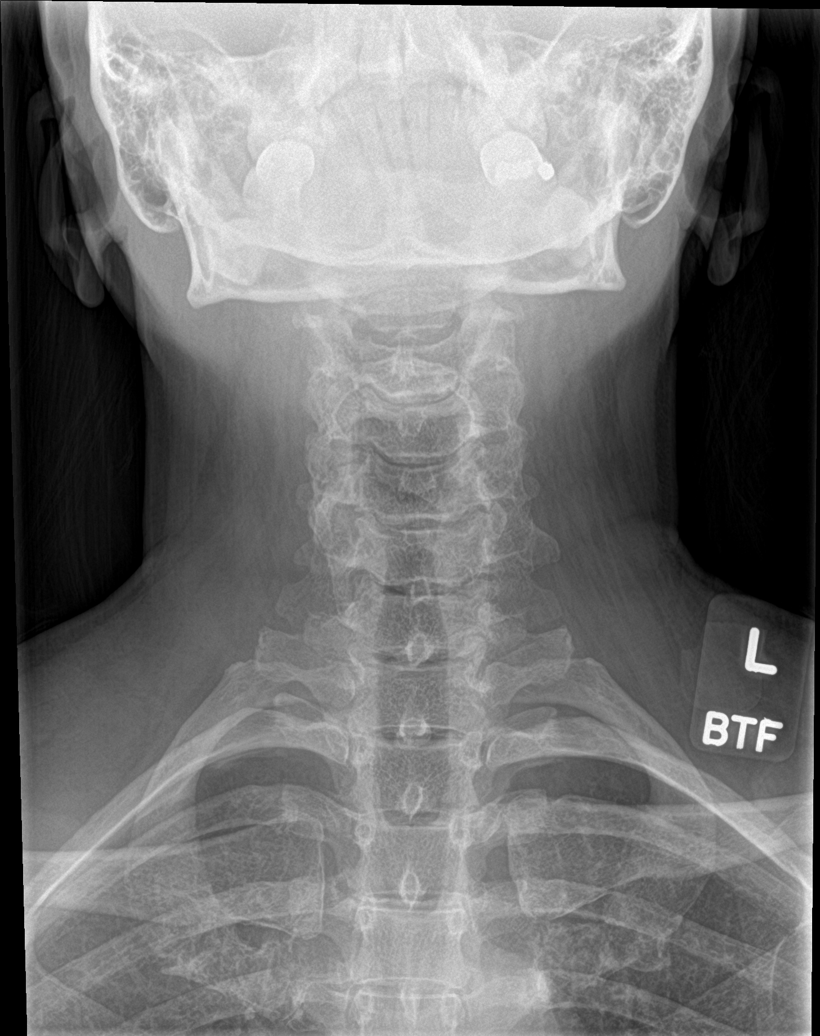

[c-spine open mouth]
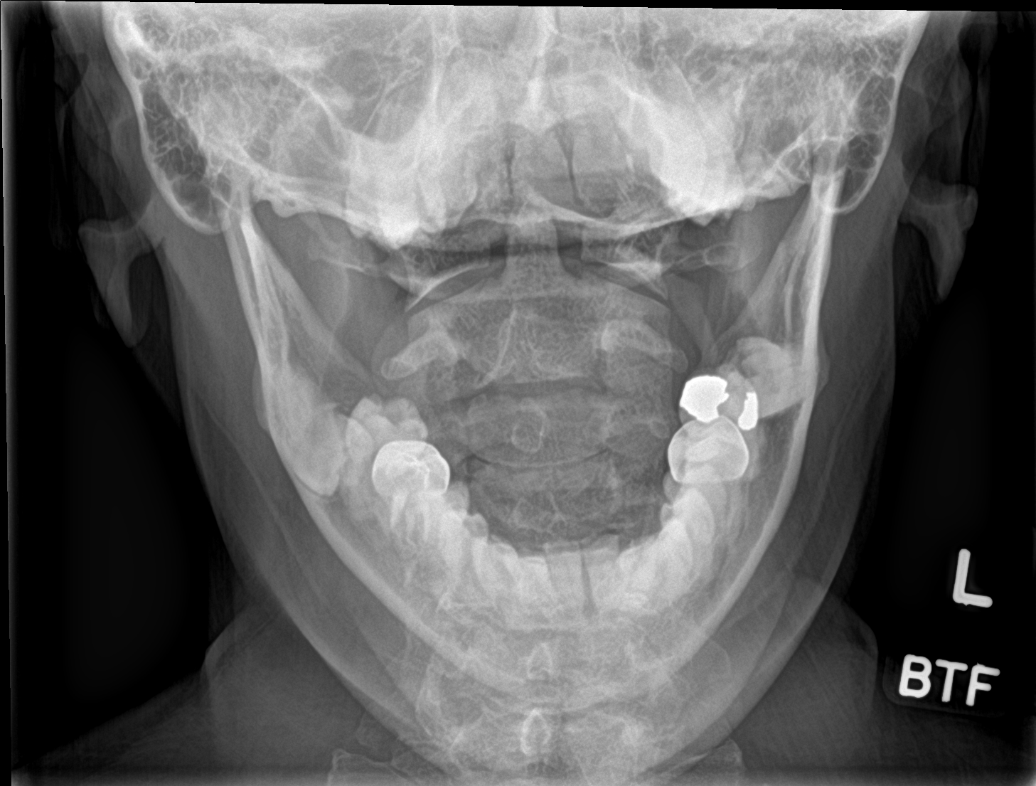

[5 of 5 positions shown; findings below may reference images not displayed]

FINDINGS: There is no evidence of cervical spine fracture or prevertebral soft
tissue swelling. Alignment is normal. moderate degenerative joint
changes throughout the cervical spine with narrowed joint space and
osteophyte formation are identified unchanged compared prior exam.
IMPRESSION: moderate degenerative joint changes of cervical spine.

## 2023-10-23 MED ORDER — PREDNISONE 20 MG PO TABS: ORAL_TABLET | ORAL | 0 refills | Status: AC

## 2023-10-23 MED ORDER — DIPHENHYDRAMINE HCL 25 MG PO CAPS: 25.0000 mg | ORAL_CAPSULE | Freq: Every evening | ORAL | Status: AC

## 2023-10-23 NOTE — Progress Notes (Signed)
 [facility] clinic  Provider:  Jereld Serum DNP  Code Status:  Full Code  Goals of Care:     11/14/2022    9:32 AM  Advanced Directives  Does Patient Have a Medical Advance Directive? No  Would patient like information on creating a medical advance directive? No - Patient declined     Chief Complaint  Patient presents with   Medical Management of Chronic Issues    6 months follow-up    Discussed the use of AI scribe software for clinical note transcription with the patient, who gave verbal consent to proceed.  HPI: Patient is a 53 y.o. female seen today for a 23-month follow up of chronic medical issues.  She experiences severe pain in her left wrist, described as a sensation of 'somebody ripping', accompanied by swelling. The pain is significant enough to prevent her from holding a plate.  She reports episodes of stool urgency and incontinence, particularly after eating. A recent incident involved sneezing and experiencing incontinence. A colonoscopy on March 25, 2020, found a 5 mm polyp that was removed and multiple small diverticula. She sometimes goes twice a day and other times may not go for two to three days. She recently took a laxative, which may have contributed to her stool incontinence.  She takes several medications including amlodipine  10 mg daily and  metoprolol  succinate 50 mg daily,for hypertension,  gabapentin  300 mg at bedtime, Aleve  220 mg as needed for chronic mid back pain, Protonix  40 mg daily for acid reflux, and simvastatin  20 mg daily at bedtime for hyperlipidemia. She also takes Benadryl  25 mg nightly for sleep.  She has not received the shingles vaccine due to fear of needles. She reports getting four to six hours of sleep per night and has not been exercising regularly due to the heat. She mentions going for a walk last week but found it too hot.    Past Medical History:  Diagnosis Date   Acid reflux    Anemia    seasonal   Heart murmur     from childhood.   Hyperlipidemia    Hypertension    Stroke [facility])    about 13 yrs ago had numbness on rt side but passed without further issues.     Past Surgical History:  Procedure Laterality Date   ABDOMINAL HYSTERECTOMY      Allergies  Allergen Reactions   Codeine Itching   Phenergan [Promethazine Hcl] Nausea And Vomiting   Sulfa Antibiotics Itching and Rash    Outpatient Encounter Medications as of 10/23/2023  Medication Sig   albuterol  (VENTOLIN  HFA) 108 (90 Base) MCG/ACT inhaler Inhale 2 puffs into the lungs every 6 (six) hours as needed.   amLODipine  (NORVASC ) 10 MG tablet Take 1 tablet (10 mg total) by mouth at bedtime.   aspirin  (ASPIRIN  81) 81 MG chewable tablet Chew 1 tablet (81 mg total) by mouth daily.   diphenhydrAMINE  (BENADRYL  ALLERGY) 25 mg capsule Take 1 capsule (25 mg total) by mouth at bedtime.   gabapentin  (NEURONTIN ) 300 MG capsule Take 1 capsule (300 mg total) by mouth at bedtime.   hydrochlorothiazide  (HYDRODIURIL ) 12.5 MG tablet Take 1 tablet (12.5 mg total) by mouth daily as needed. For lower extremity swelling   metoprolol  succinate (TOPROL -XL) 50 MG 24 hr tablet Take 1 tablet (50 mg total) by mouth daily. Take with or immediately following a meal.   naproxen  sodium (ALEVE ) 220 MG tablet Take 1 tablet (220 mg total) by mouth daily as needed.  pantoprazole  (PROTONIX ) 40 MG tablet Take 1 tablet (40 mg total) by mouth daily.   predniSONE  (DELTASONE ) 20 MG tablet Take 2 tablets (40 mg total) by mouth daily with breakfast for 3 days, THEN 1 tablet (20 mg total) daily with breakfast for 3 days.   simvastatin  (ZOCOR ) 20 MG tablet Take 1 tablet (20 mg total) by mouth at bedtime.   Facility-Administered Encounter Medications as of 10/23/2023  Medication   0.9 %  sodium chloride  infusion    Review of Systems:  Review of Systems  Constitutional:  Negative for appetite change, chills, fatigue and fever.  HENT:  Negative for congestion, hearing loss, rhinorrhea  and sore throat.   Eyes: Negative.   Respiratory:  Negative for cough, shortness of breath and wheezing.   Cardiovascular:  Negative for chest pain, palpitations and leg swelling.  Gastrointestinal:  Negative for abdominal pain, constipation, diarrhea, nausea and vomiting.       Stool incontinence  Genitourinary:  Negative for dysuria.  Musculoskeletal:  Positive for arthralgias and joint swelling. Negative for back pain and myalgias.  Skin:  Negative for color change, rash and wound.  Neurological:  Negative for dizziness, weakness and headaches.  Psychiatric/Behavioral:  Negative for behavioral problems. The patient is not nervous/anxious.     Health Maintenance  Topic Date Due   Hepatitis B Vaccines (1 of 3 - 19+ 3-dose series) Never done   Zoster Vaccines- Shingrix (1 of 2) Never done   COVID-19 Vaccine (1 - 2024-25 season) 11/27/2023 (Originally 11/27/2022)   DTaP/Tdap/Td (1 - Tdap) 04/23/2024 (Originally 07/01/1989)   INFLUENZA VACCINE  10/27/2023   MAMMOGRAM  01/11/2025   Colonoscopy  03/25/2030   Hepatitis C Screening  Completed   HIV Screening  Completed   HPV VACCINES  Aged Out   Meningococcal B Vaccine  Aged Out    Physical Exam: Vitals:   10/23/23 1333  BP: 135/76  Pulse: 75  Resp: 18  Temp: 98.2 F (36.8 C)  SpO2: 99%  Weight: 171 lb 9.6 oz (77.8 kg)  Height: 5' 4 (1.626 m)   Body mass index is 29.46 kg/m. Physical Exam Constitutional:      Appearance: Normal appearance.  HENT:     Head: Normocephalic and atraumatic.     Nose: Nose normal.     Mouth/Throat:     Mouth: Mucous membranes are moist.  Eyes:     Conjunctiva/sclera: Conjunctivae normal.  Cardiovascular:     Rate and Rhythm: Normal rate and regular rhythm.  Pulmonary:     Effort: Pulmonary effort is normal.     Breath sounds: Normal breath sounds.  Abdominal:     General: Bowel sounds are normal.     Palpations: Abdomen is soft.  Musculoskeletal:     Cervical back: Normal range of  motion.     Comments: Left wrist pain  Skin:    General: Skin is warm and dry.  Neurological:     General: No focal deficit present.     Mental Status: She is alert and oriented to person, place, and time.  Psychiatric:        Mood and Affect: Mood normal.        Behavior: Behavior normal.        Thought Content: Thought content normal.        Judgment: Judgment normal.     Labs reviewed: Basic Metabolic Panel: Recent Labs    04/24/23 1450 07/24/23 0813  NA 143 138  K 4.2 4.3  CL 106 101  CO2 28 29  GLUCOSE 106 106*  BUN 11 11  CREATININE 0.87 1.05*  CALCIUM 9.6 9.6   Liver Function Tests: No results for input(s): AST, ALT, ALKPHOS, BILITOT, PROT, ALBUMIN in the last 8760 hours. No results for input(s): LIPASE, AMYLASE in the last 8760 hours. No results for input(s): AMMONIA in the last 8760 hours. CBC: No results for input(s): WBC, NEUTROABS, HGB, HCT, MCV, PLT in the last 8760 hours. Lipid Panel: Recent Labs    07/24/23 0813  CHOL 166  HDL 49*  LDLCALC 97  TRIG 895  CHOLHDL 3.4   Lab Results  Component Value Date   HGBA1C 6.2 (H) 07/24/2023    Procedures since last visit: No results found.  Assessment/Plan  1. Left wrist pain (Primary) -   pain and swelling, affecting grip. - Prescribed prednisone  taper: 40 mg daily for 3 days, then 20 mg daily for 3 days. - DG Wrist Complete Left - Uric acid - predniSONE  (DELTASONE ) 20 MG tablet; Take 2 tablets (40 mg total) by mouth daily with breakfast for 3 days, THEN 1 tablet (20 mg total) daily with breakfast for 3 days.  Dispense: 9 tablet; Refill: 0  2. Primary hypertension - Blood pressure control monitored on amlodipine  and metoprolol .  3. Hyperlipidemia, unspecified hyperlipidemia type Lab Results  Component Value Date   CHOL 166 07/24/2023   HDL 49 (L) 07/24/2023   LDLCALC 97 07/24/2023   TRIG 104 07/24/2023   CHOLHDL 3.4 07/24/2023    -  Lipid levels effectively  controlled on simvastatin .  4. Prediabetes Lab Results  Component Value Date   HGBA1C 6.2 (H) 07/24/2023    -  diet-controlled  5. Incontinence of feces, unspecified fecal incontinence type -  possibly due to laxative use. Normal colonoscopy in 2021. - Check TSH to rule out hyperthyroidism. - TSH  6. Primary insomnia -   Reports 4-6 hours of sleep nightly on Benadryl  and gabapentin . - Encouraged regular exercise in the morning to improve sleep quality. - diphenhydrAMINE  (BENADRYL  ALLERGY) 25 mg capsule; Take 1 capsule (25 mg total) by mouth at bedtime.  7. Bilateral hip pain -  continue Aleve  220 mg daily PRN - DG HIPS BILAT WITH PELVIS 3-4 VIEWS  8. GERD -  Acid reflux well-controlled with Protonix  40 mg daily.    General Health Maintenance Has not received shingles vaccine due to needle fear. - Advised to receive shingles vaccine at pharmacy.  Follow-up Scheduled for six-month follow-up with additional evaluations. - Schedule follow-up appointment in six months. - Order x-ray of hips. - Check uric acid levels.      Labs/tests ordered:   tsh, uric acid, x-ray of bilateral hips and left wrist   Return in about 6 months (around 04/24/2024).  Shelsea Hangartner Medina-Vargas, NP

## 2023-10-24 ENCOUNTER — Ambulatory Visit: Payer: Self-pay | Admitting: Adult Health

## 2023-10-24 ENCOUNTER — Ambulatory Visit
Admission: RE | Admit: 2023-10-24 | Discharge: 2023-10-24 | Disposition: A | Discharge status: Home / Self Care | Source: Ambulatory Visit | Attending: Adult Health | Admitting: Adult Health

## 2023-10-24 NOTE — Progress Notes (Signed)
-   TSH and uric acid within normal, negative for gout

## 2023-10-31 NOTE — Progress Notes (Signed)
-    left wrist has a questionable soft tissue thickening which is possibly due to inflammation. Did the Prednisone  help with the pain?

## 2023-10-31 NOTE — Progress Notes (Signed)
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

## 2023-11-01 ENCOUNTER — Other Ambulatory Visit: Payer: Self-pay | Admitting: Adult Health

## 2023-11-01 DIAGNOSIS — M25551 Pain in right hip: Secondary | ICD-10-CM

## 2023-11-01 DIAGNOSIS — M25532 Pain in left wrist: Secondary | ICD-10-CM

## 2023-11-01 MED ORDER — PREDNISONE 20 MG PO TABS: 20.0000 mg | ORAL_TABLET | Freq: Every day | ORAL | 0 refills | Status: AC

## 2023-11-01 NOTE — Progress Notes (Signed)
 Sent Prednisone  eRx to pharmacy.

## 2023-11-29 ENCOUNTER — Other Ambulatory Visit: Payer: Self-pay | Admitting: Adult Health

## 2023-11-29 DIAGNOSIS — G8929 Other chronic pain: Secondary | ICD-10-CM

## 2023-11-29 MED ORDER — GABAPENTIN 300 MG PO CAPS: 300.0000 mg | ORAL_CAPSULE | Freq: Every evening | ORAL | 1 refills | Status: DC

## 2023-11-29 NOTE — Telephone Encounter (Signed)
 Copied from CRM #[id number]. Topic: Clinical - Medication Refill >> Nov 29, 2023 11:08 AM Suzette B wrote: Medication: gabapentin  (NEURONTIN ) 300 MG capsule  Has the patient contacted their pharmacy? No Usually they send a text message letting her know but no text has been receieved  This is the patient's preferred pharmacy:  [facility] - [place] ([place] - [address] [place] ([place]) [place] [address] Phone: [phone] Fax: [phone]  Is this the correct pharmacy for this prescription? Yes If no, delete pharmacy and type the correct one.   Has the prescription been filled recently? Yes 05/25   Is the patient out of the medication? Yes  Has the patient been seen for an appointment in the last year OR does the patient have an upcoming appointment? Yes  Can we respond through MyChart? Yes  Agent: Please be advised that Rx refills may take up to 3 business days. We ask that you follow-up with your pharmacy.

## 2024-01-11 ENCOUNTER — Encounter: Payer: Self-pay | Admitting: Adult Health

## 2024-01-18 LAB — HM MAMMOGRAPHY

## 2024-02-05 ENCOUNTER — Telehealth: Payer: Self-pay

## 2024-02-05 NOTE — Telephone Encounter (Signed)
 Copied from CRM [id number]. Topic: Clinical - Request for Lab/Test Order >> Feb 05, 2024 11:57 AM Diannia H wrote: Reason for CRM: Patient is wanting to schedule another mammogram and ultrasound on the right breast at [facility]. Could you assist? Patients callback number is [phone].

## 2024-02-08 ENCOUNTER — Other Ambulatory Visit: Payer: Self-pay | Admitting: Adult Health

## 2024-02-09 ENCOUNTER — Other Ambulatory Visit: Payer: Self-pay | Admitting: Adult Health

## 2024-02-09 ENCOUNTER — Other Ambulatory Visit: Payer: Self-pay | Admitting: Nurse Practitioner

## 2024-02-09 DIAGNOSIS — G8929 Other chronic pain: Secondary | ICD-10-CM

## 2024-02-09 DIAGNOSIS — R928 Other abnormal and inconclusive findings on diagnostic imaging of breast: Secondary | ICD-10-CM

## 2024-02-09 NOTE — Telephone Encounter (Signed)
 ALL OF THE REQUESTED MEDICATIONS SHOULD BE AT THE PHARMACY   AMLODIPINE  AND SIMVASTATIN  WERE APPROVED YESTERDAY   GABAPENTIN  WAS APPROVED IN SEPTEMBER # 90 (SEPT, OCT, AND NOV) WITH 1 ADDITIONAL REFILL (DEC, JAN, AND FEB 2026)

## 2024-02-09 NOTE — Telephone Encounter (Signed)
 Copied from CRM #[id number]. Topic: Clinical - Medication Refill >> Feb 09, 2024 11:09 AM Susanna ORN wrote: Medication: amLODipine  (NORVASC ) 10 MG tablet, simvastatin  (ZOCOR ) 20 MG tablet, & gabapentin  (NEURONTIN ) 300 MG capsule  Has the patient contacted their pharmacy? Yes, pharmacy told her that she needs refills and also the gabapentin  is refill too soon. States she only has one pill left of Gabapentin . (Agent: If no, request that the patient contact the pharmacy for the refill. If patient does not wish to contact the pharmacy document the reason why and proceed with request.) (Agent: If yes, when and what did the pharmacy advise?)  This is the patient's preferred pharmacy:  [facility] - [place] ([place]), [place] - [address] [place] ([place]) [place] [address] Phone: [phone] Fax: [phone]  Is this the correct pharmacy for this prescription? Yes If no, delete pharmacy and type the correct one.   Has the prescription been filled recently? Yes  Is the patient out of the medication? Yes  Has the patient been seen for an appointment in the last year OR does the patient have an upcoming appointment? Yes  Can we respond through MyChart? Yes  Agent: Please be advised that Rx refills may take up to 3 business days. We ask that you follow-up with your pharmacy.

## 2024-02-09 NOTE — Telephone Encounter (Signed)
 No orders in chart. Last mammogram was done at [facility] 01/18/24. Please have provider place orders as per PCP review and determination.

## 2024-02-09 NOTE — Telephone Encounter (Signed)
 Mast, Man X, NP to Robin Fuentes, [facility]      02/09/24 11:37 AM Ordered mammogram and US  R breast [facility]. Thanks.   Message sent to Robin Fuentes

## 2024-02-15 ENCOUNTER — Other Ambulatory Visit: Payer: Self-pay | Admitting: Nurse Practitioner

## 2024-02-15 DIAGNOSIS — R928 Other abnormal and inconclusive findings on diagnostic imaging of breast: Secondary | ICD-10-CM

## 2024-03-07 ENCOUNTER — Ambulatory Visit: Admitting: Adult Health

## 2024-03-18 ENCOUNTER — Other Ambulatory Visit

## 2024-03-18 ENCOUNTER — Other Ambulatory Visit: Payer: Self-pay | Admitting: Adult Health

## 2024-03-18 DIAGNOSIS — R7303 Prediabetes: Secondary | ICD-10-CM

## 2024-03-18 LAB — CBC WITH DIFFERENTIAL/PLATELET
Absolute Lymphocytes: 1945 {cells}/uL (ref 850–3900)
Absolute Monocytes: 348 {cells}/uL (ref 200–950)
Basophils Absolute: 31 {cells}/uL (ref 0–200)
Basophils Relative: 0.6 %
Eosinophils Absolute: 151 {cells}/uL (ref 15–500)
Eosinophils Relative: 2.9 %
HCT: 40.6 % (ref 35.9–46.0)
Hemoglobin: 12.2 g/dL (ref 11.7–15.5)
MCH: 22 pg — ABNORMAL LOW (ref 27.0–33.0)
MCHC: 30 g/dL — ABNORMAL LOW (ref 31.6–35.4)
MCV: 73.3 fL — ABNORMAL LOW (ref 81.4–101.7)
MPV: 10.9 fL (ref 7.5–12.5)
Monocytes Relative: 6.7 %
Neutro Abs: 2725 {cells}/uL (ref 1500–7800)
Neutrophils Relative %: 52.4 %
Platelets: 255 Thousand/uL (ref 140–400)
RBC: 5.54 Million/uL — ABNORMAL HIGH (ref 3.80–5.10)
RDW: 13.7 % (ref 11.0–15.0)
Total Lymphocyte: 37.4 %
WBC: 5.2 Thousand/uL (ref 3.8–10.8)

## 2024-03-18 LAB — COMPREHENSIVE METABOLIC PANEL WITH GFR
AG Ratio: 1.3 (calc) (ref 1.0–2.5)
ALT: 13 U/L (ref 6–29)
AST: 13 U/L (ref 10–35)
Albumin: 4.2 g/dL (ref 3.6–5.1)
Alkaline phosphatase (APISO): 96 U/L (ref 37–153)
BUN/Creatinine Ratio: 9 (calc) (ref 6–22)
BUN: 10 mg/dL (ref 7–25)
CO2: 28 mmol/L (ref 20–32)
Calcium: 9.2 mg/dL (ref 8.6–10.4)
Chloride: 107 mmol/L (ref 98–110)
Creat: 1.08 mg/dL — ABNORMAL HIGH (ref 0.50–1.03)
Globulin: 3.2 g/dL (ref 1.9–3.7)
Glucose, Bld: 99 mg/dL (ref 65–99)
Potassium: 4.6 mmol/L (ref 3.5–5.3)
Sodium: 142 mmol/L (ref 135–146)
Total Bilirubin: 0.4 mg/dL (ref 0.2–1.2)
Total Protein: 7.4 g/dL (ref 6.1–8.1)
eGFR: 61 mL/min/1.73m2

## 2024-03-18 LAB — HEMOGLOBIN A1C
Hgb A1c MFr Bld: 6.1 % — ABNORMAL HIGH
Mean Plasma Glucose: 128 mg/dL
eAG (mmol/L): 7.1 mmol/L

## 2024-03-19 ENCOUNTER — Ambulatory Visit: Payer: Self-pay | Admitting: Adult Health

## 2024-03-19 NOTE — Progress Notes (Signed)
-   A1C 6.1, down from 6.2, continue diet and exercise -  electrolytes and liver enzymes, all normal -  no anemia

## 2024-04-08 ENCOUNTER — Ambulatory Visit: Admitting: Adult Health

## 2024-04-08 ENCOUNTER — Encounter: Payer: Self-pay | Admitting: Adult Health

## 2024-04-08 VITALS — BP 123/84 | HR 89 | Ht 64.0 in | Wt 179.5 lb

## 2024-04-08 DIAGNOSIS — N9412 Deep dyspareunia: Secondary | ICD-10-CM | POA: Diagnosis not present

## 2024-04-08 DIAGNOSIS — Z90710 Acquired absence of both cervix and uterus: Secondary | ICD-10-CM

## 2024-04-08 DIAGNOSIS — Z01419 Encounter for gynecological examination (general) (routine) without abnormal findings: Secondary | ICD-10-CM

## 2024-04-08 NOTE — Progress Notes (Signed)
 Patient ID: Robin Fuentes, female   DOB: 1971/01/17, 54 y.o.   MRN: [id number] History of Present Illness: Robin Fuentes is a 54 year old black female, with SO, sp hysterectomy in for a well woman gyn exam.  PCP is Robin  Medina-Vargas NP  Current Medications, Allergies, Past Medical History, Past Surgical History, Family History and Social History were reviewed in [employer] record.     Review of Systems: Patient denies any headaches, hearing loss, fatigue, shortness of breath, chest pain, abdominal pain, problems with bowel movements, urination, or intercourse(has pain with sex to the right and midline with deep penetration. No joint pain or mood swings.  Has noticed vision changes to see eye doctor    Physical Exam:BP 123/84 (BP Location: Left Arm, Patient Position: Sitting, Cuff Size: Normal)   Pulse 89   Ht 5' 4 (1.626 m)   Wt 179 lb 8 oz (81.4 kg)   BMI 30.81 kg/m   General:  Well developed, well nourished, no acute distress Skin:  Warm and dry Neck:  Midline trachea, normal thyroid, good ROM, no lymphadenopathy Lungs; Clear to auscultation bilaterally Breast:  No dominant palpable mass, retraction, or nipple discharge Cardiovascular: Regular rate and rhythm Abdomen:  Soft, non tender, no hepatosplenomegaly Pelvic:  External genitalia is normal in appearance, no lesions.  The vagina is normal in appearance. Urethra has no lesions or masses. The cervix and uterus are absent, can reproduce pain with finger that has with sex, it is to the right and midline at vaginal cuff.  No adnexal masses or tenderness noted.Bladder is non tender, no masses felt. Rectal: Deferred Extremities/musculoskeletal:  No swelling or varicosities noted, no clubbing or cyanosis Psych:  No mood changes, alert and cooperative,seems happy AA is 1 Fall risk is low    04/08/2024    2:45 PM 04/24/2023    2:13 PM 10/21/2022    2:18 PM  Depression screen PHQ 2/9  Decreased Interest 0 0 0   Down, Depressed, Hopeless 0 0 0  PHQ - 2 Score 0 0 0  Altered sleeping 2  0  Tired, decreased energy 2  0  Change in appetite 0  0  Feeling bad or failure about yourself  2  0  Trouble concentrating 0  0  Moving slowly or fidgety/restless 0  0  Suicidal thoughts 0  0  PHQ-9 Score 6  0      Data saved with a previous flowsheet row definition       04/08/2024    2:45 PM 08/16/2021    1:46 PM 08/12/2020    2:18 PM  GAD 7 : Generalized Anxiety Score  Nervous, Anxious, on Edge 1 0 2  Control/stop worrying 0 0 0  Worry too much - different things 1 0 2  Trouble relaxing 0 0 0  Restless 0 0 0  Easily annoyed or irritable 1 0 2  Afraid - awful might happen 0 0 0  Total GAD 7 Score 3 0 6    Upstream - 04/08/24 1444       Pregnancy Intention Screening   Does the patient want to become pregnant in the next year? N/A    Does the patient's partner want to become pregnant in the next year? N/A    Would the patient like to discuss contraceptive options today? N/A      Contraception Wrap Up   Current Method Hysterectomy    End Method Hysterectomy    Contraception Counseling Provided  No           Examination chaperoned by Clarita Salt LPN   Impression and plan: 1. Encounter for well woman exam with routine gynecological exam (Primary) Physical in 1 year Labs with PCP Mammogram was 01/18/24 need more imaging and had that 03/27/24, was negative she says Colonoscopy per GI  Follow up with PCP  2. S/P hysterectomy  3. Deep dyspareunia Has pain with sex with deep penetration Discussed positions where he does not get has good penetration

## 2024-04-12 ENCOUNTER — Telehealth: Payer: Self-pay

## 2024-04-12 DIAGNOSIS — G8929 Other chronic pain: Secondary | ICD-10-CM

## 2024-04-12 MED ORDER — GABAPENTIN 300 MG PO CAPS: 300.0000 mg | ORAL_CAPSULE | Freq: Every evening | ORAL | 1 refills | Status: AC

## 2024-04-12 NOTE — Telephone Encounter (Signed)
 Copied from CRM #[id number]. Topic: Clinical - Medication Refill >> Apr 12, 2024 12:41 PM Chiquita SQUIBB wrote: Medication: gabapentin  gabapentin  (NEURONTIN ) 300 MG capsule   Has the patient contacted their pharmacy? Yes (Agent: If no, request that the patient contact the pharmacy for the refill. If patient does not wish to contact the pharmacy document the reason why and proceed with request.) (Agent: If yes, when and what did the pharmacy advise?)  This is the patient's preferred pharmacy:  [id number] - [place] ([place] - [address] [place] ([place]) [place] [address] Phone: [phone] Fax: [phone]  Is this the correct pharmacy for this prescription? Yes If no, delete pharmacy and type the correct one.   Has the prescription been filled recently? No  Is the patient out of the medication? No- Patient has two left  Has the patient been seen for an appointment in the last year OR does the patient have an upcoming appointment? Yes  Can we respond through MyChart? Yes  Agent: Please be advised that Rx refills may take up to 3 business days. We ask that you follow-up with your pharmacy.

## 2024-04-19 ENCOUNTER — Ambulatory Visit: Admitting: Adult Health

## 2024-04-25 ENCOUNTER — Ambulatory Visit: Payer: Self-pay | Admitting: Adult Health
